# Patient Record
Sex: Female | Born: 1935 | Race: White | Hispanic: No | Marital: Married | Smoking: Former smoker
Health system: Southern US, Community
[De-identification: ages and names within clinical notes are randomized; demographics above are authoritative.]

## PROBLEM LIST (undated history)

## (undated) DIAGNOSIS — I671 Cerebral aneurysm, nonruptured: Secondary | ICD-10-CM

## (undated) DIAGNOSIS — F32A Depression, unspecified: Secondary | ICD-10-CM

## (undated) HISTORY — DX: Depression, unspecified: F32.A

## (undated) HISTORY — DX: Cerebral aneurysm, nonruptured: I67.1

## (undated) HISTORY — PX: OTHER SURGICAL HISTORY: SHX169

---

## 2001-01-05 ENCOUNTER — Ambulatory Visit (HOSPITAL_COMMUNITY): Admission: RE | Admit: 2001-01-05 | Discharge: 2001-01-06 | Payer: Self-pay | Admitting: Ophthalmology

## 2001-01-05 ENCOUNTER — Encounter: Payer: Self-pay | Admitting: Ophthalmology

## 2006-03-04 ENCOUNTER — Ambulatory Visit: Payer: Self-pay | Admitting: Family Medicine

## 2006-06-27 ENCOUNTER — Encounter: Admission: RE | Admit: 2006-06-27 | Discharge: 2006-06-27 | Payer: Self-pay | Admitting: Neurological Surgery

## 2006-07-28 ENCOUNTER — Encounter: Admission: RE | Admit: 2006-07-28 | Discharge: 2006-07-28 | Payer: Self-pay | Admitting: Neurological Surgery

## 2008-03-26 IMAGING — CR DG LUMBAR SPINE 2-3V
3 series · 3 of 3 positions shown · non-contrast
Comparison: none

CLINICAL DATA: Low back pain.  Injured recently.
 LUMBAR SPINE ? 2 VIEW:

[t l-spine a.p.]
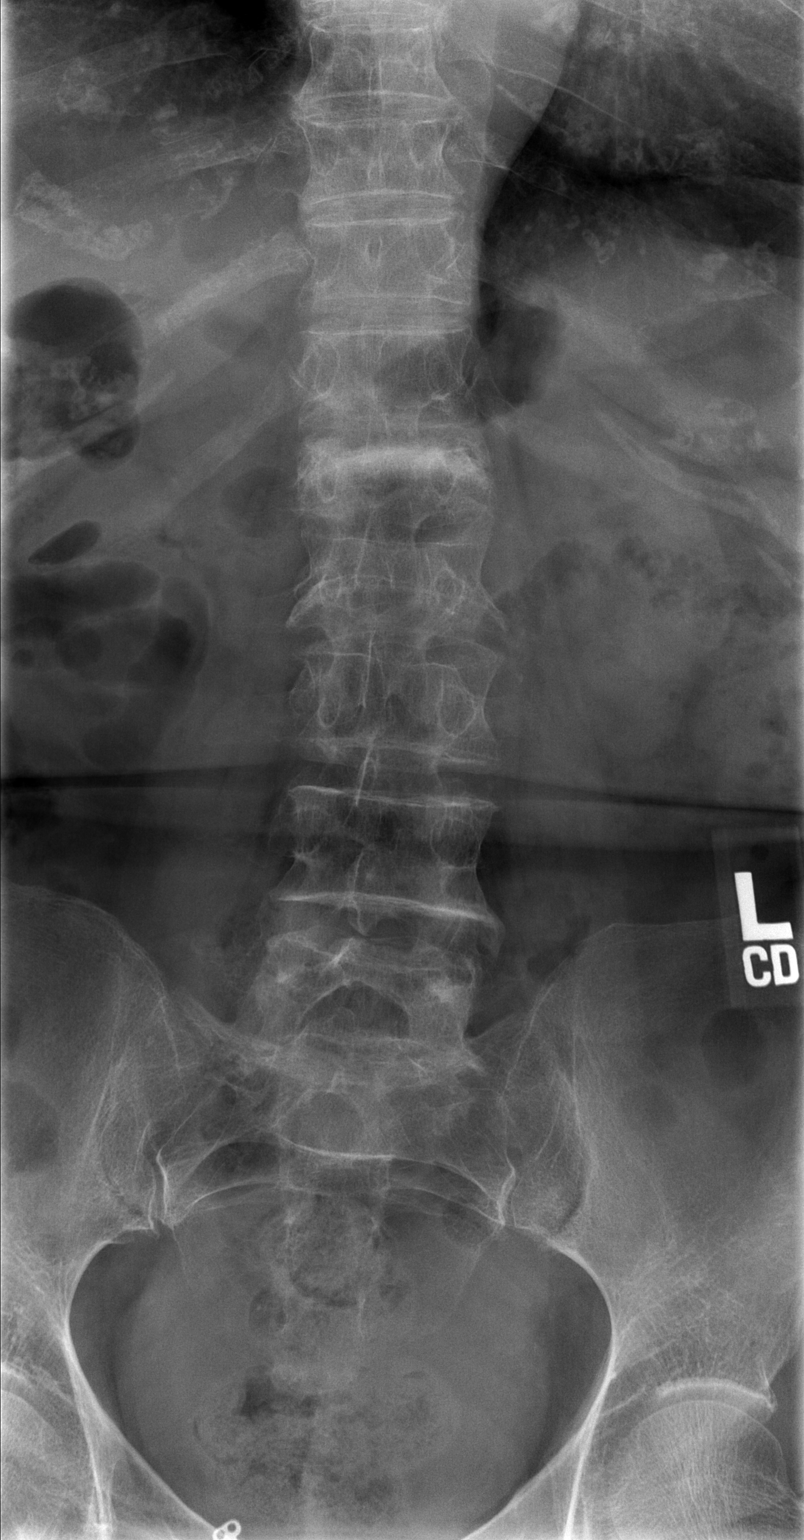

[t l-spine lat]
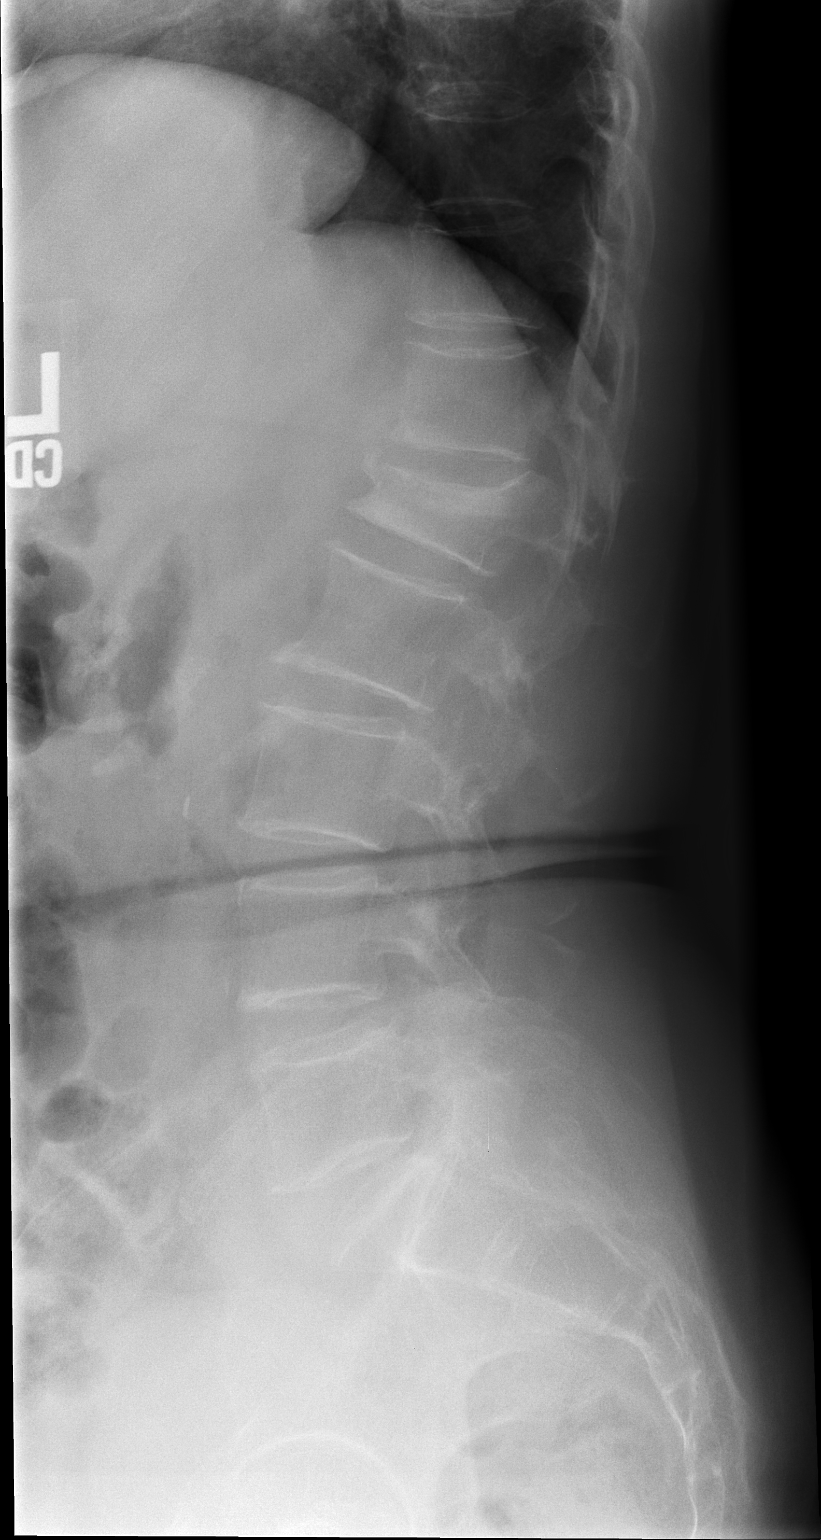

[t l-spine l5-s1 spot]
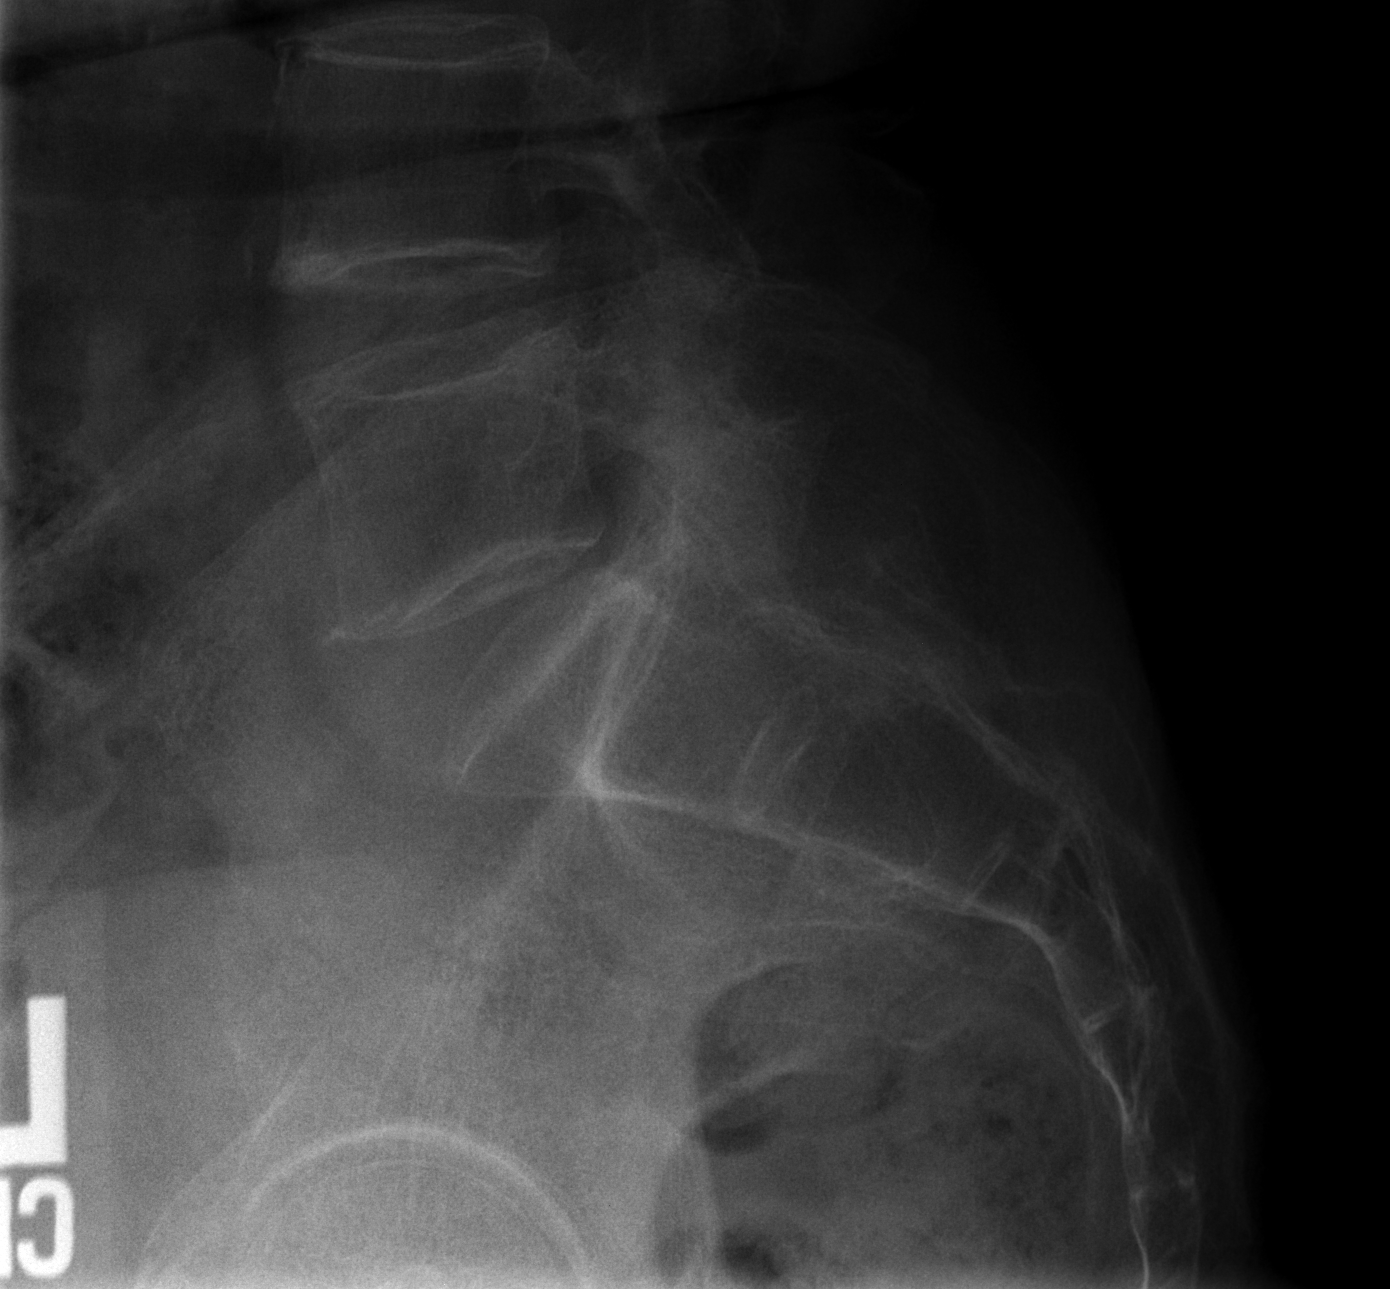

[3 of 3 positions shown; findings below may reference images not displayed]

FINDINGS: Two views of the lumbar spine show a compression deformity of the L1 vertebral body of approximately 50%.  No definite retropulsion is seen, and normal alignment otherwise is maintained.  The bones are osteopenic.   The SI joints appear normal.
IMPRESSION: Approximately 50% compression deformity of the L1 vertebral body.  Normal alignment.

## 2008-04-26 IMAGING — CR DG LUMBAR SPINE 2-3V
3 series · 3 of 3 positions shown · non-contrast
Comparison: 06/27/2006.

CLINICAL DATA: Followup L1 compression fracture. Low back pain intermittently,
with certain movements.

LUMBAR SPINE - 3 VIEW

[view not recorded (1 of 3)]
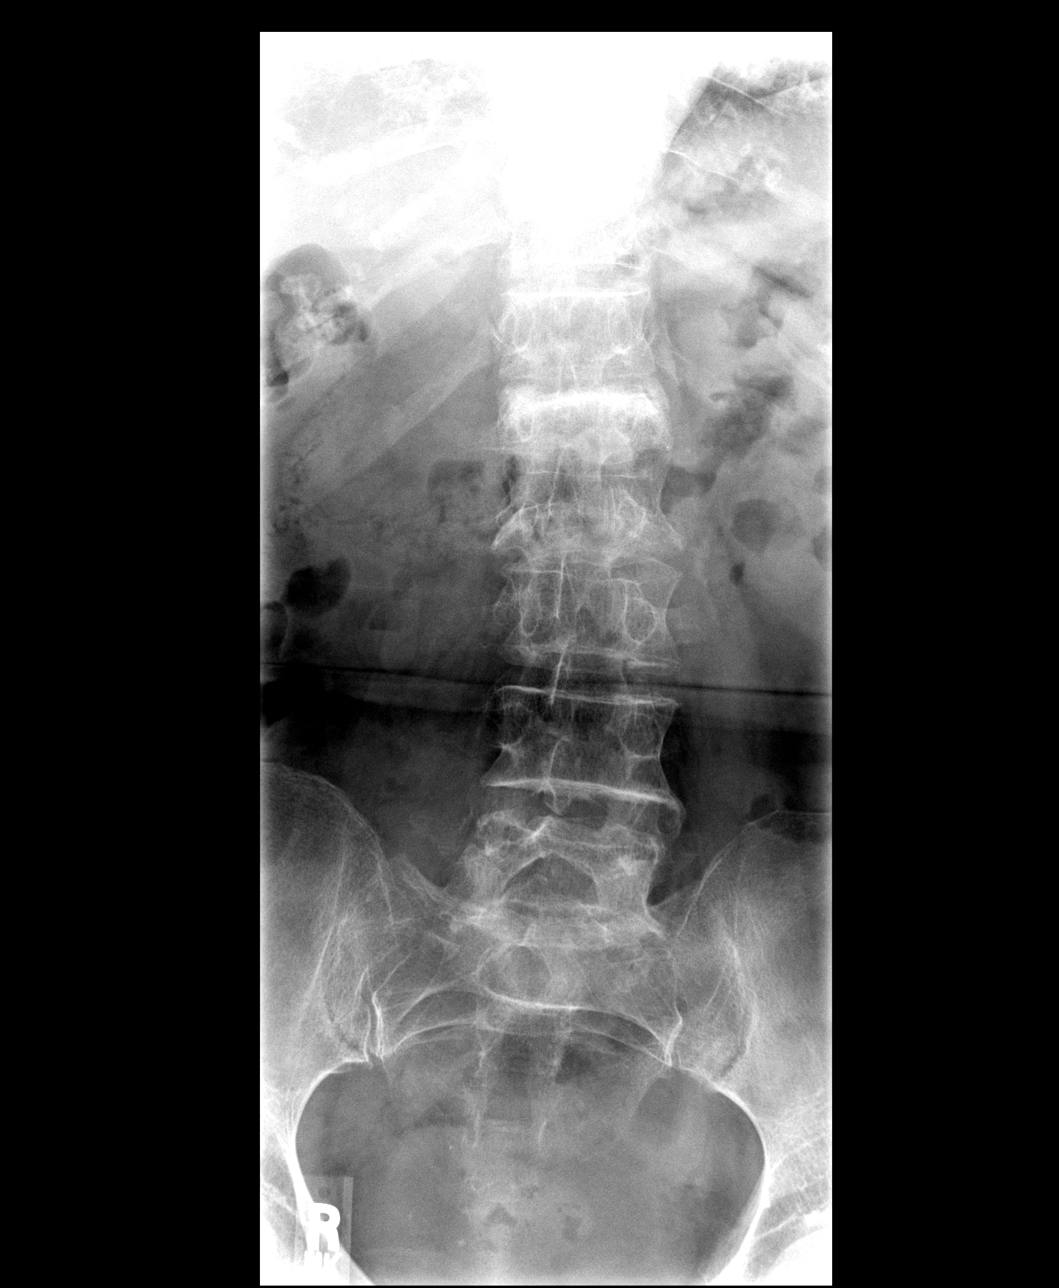

[view not recorded (2 of 3)]
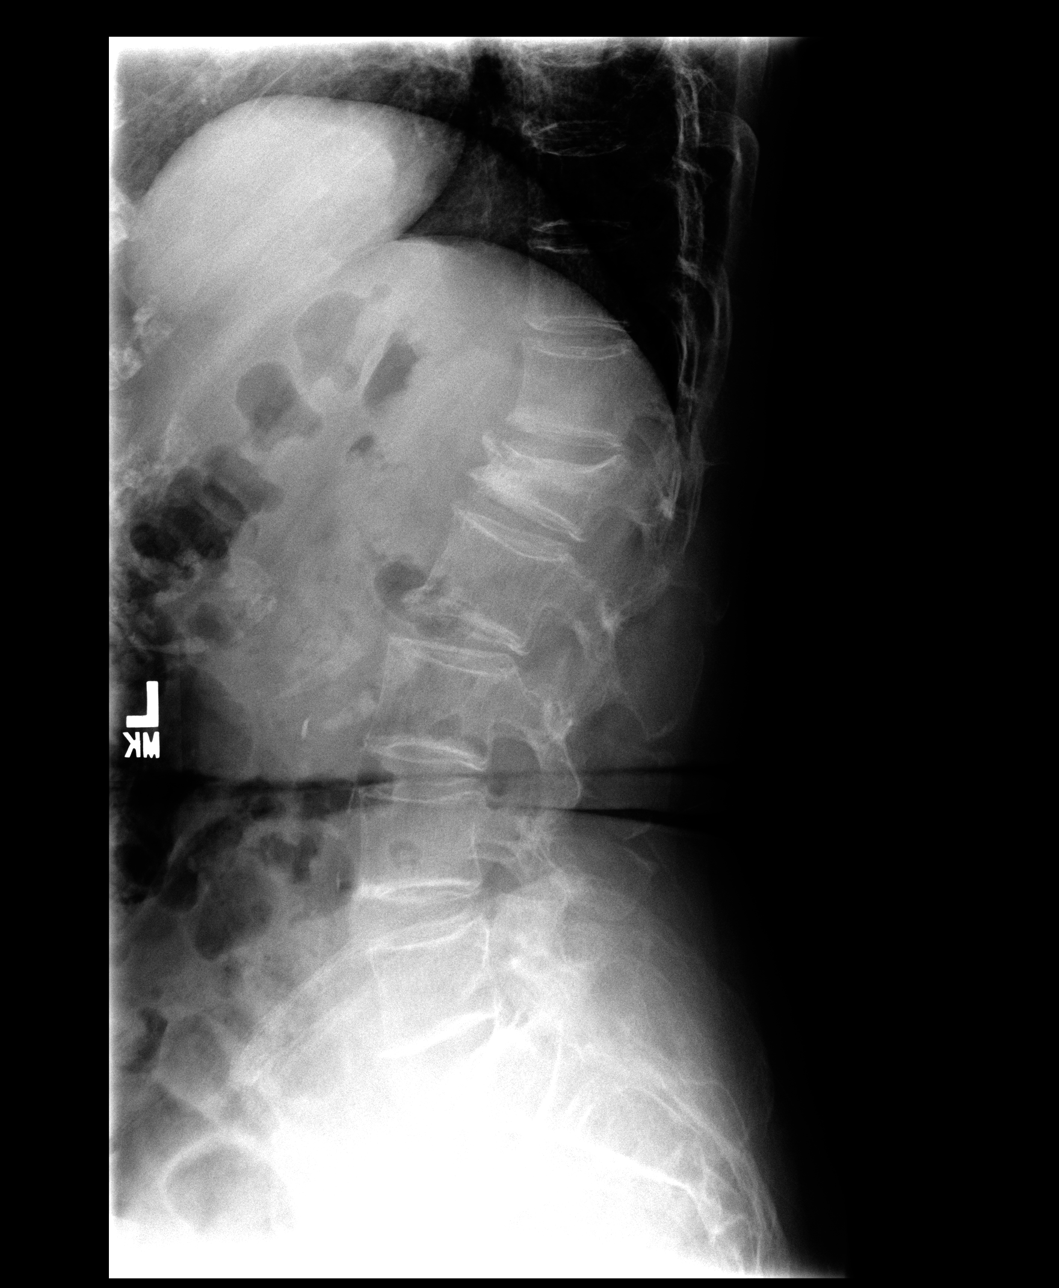

[view not recorded (3 of 3)]
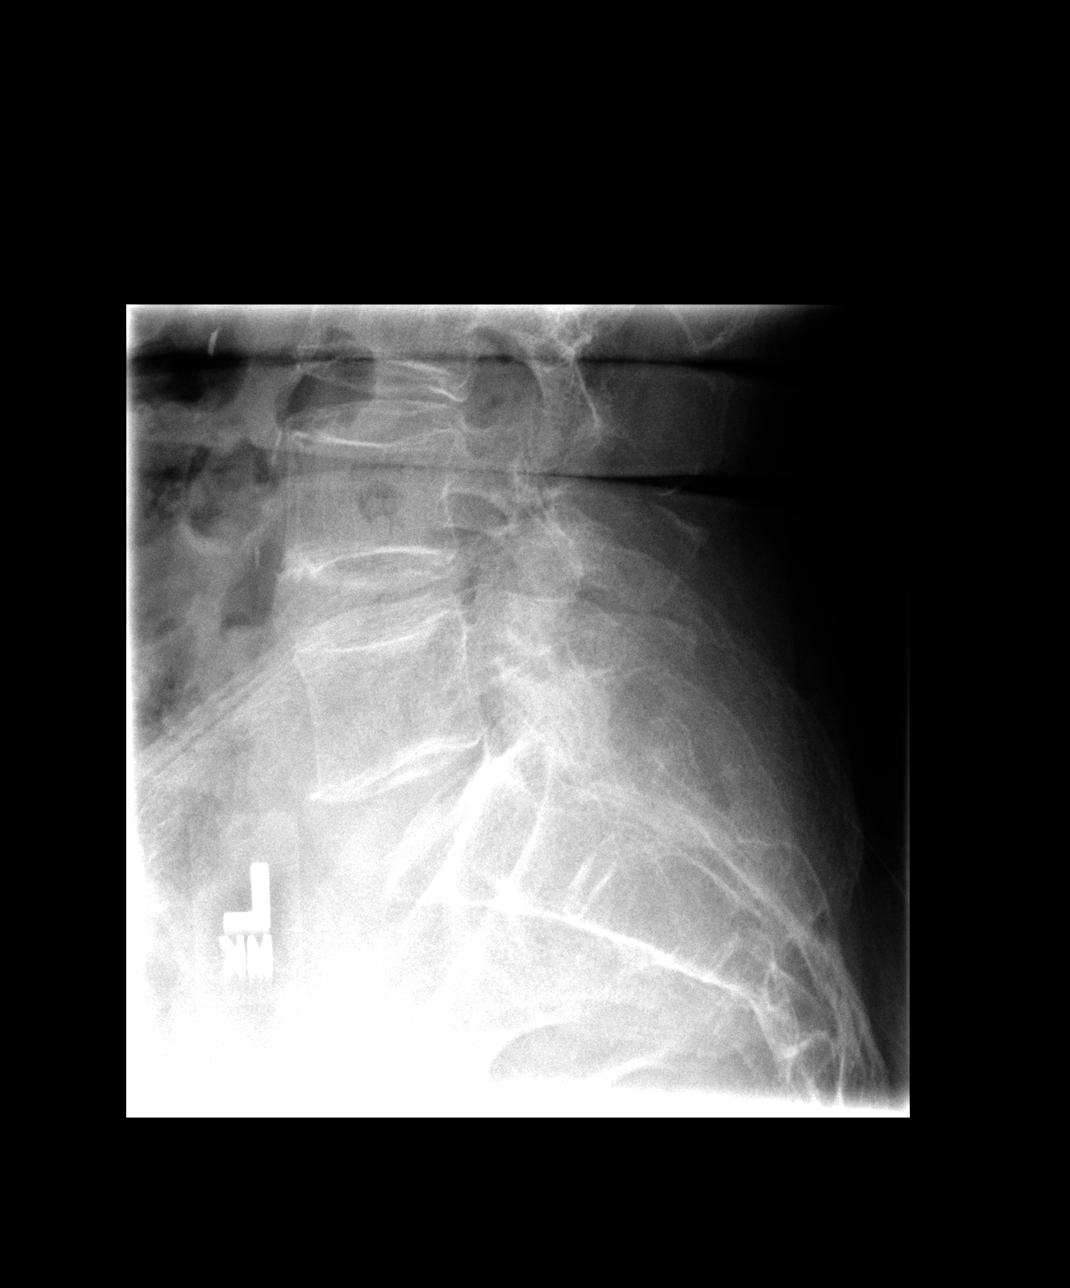

[3 of 3 positions shown; findings below may reference images not displayed]

FINDINGS: Five nonrib-bearing lumbar vertebrae. No stable mild levoconvex
lumbar rotary scoliosis. Mildly progressive sclerosis associated with an
approximately 50% L1 vertebral compression deformity. The degree of compression
is unchanged. No new fractures are seen. Lower lumbar spine facet degenerative
changes and mild to moderate anterior spondylosis at the L2-L3 level.

IMPRESSION

1. Mild increase in sclerosis associated with an approximately 50% L1 vertebral
compression fracture, compatible with further healing.
2. Stable degenerative changes.
3. No acute abnormality.

## 2017-10-22 DIAGNOSIS — Z1331 Encounter for screening for depression: Secondary | ICD-10-CM | POA: Diagnosis not present

## 2017-10-22 DIAGNOSIS — Z6823 Body mass index (BMI) 23.0-23.9, adult: Secondary | ICD-10-CM | POA: Diagnosis not present

## 2017-10-22 DIAGNOSIS — I671 Cerebral aneurysm, nonruptured: Secondary | ICD-10-CM | POA: Diagnosis not present

## 2017-10-22 DIAGNOSIS — Z2821 Immunization not carried out because of patient refusal: Secondary | ICD-10-CM | POA: Diagnosis not present

## 2017-10-22 DIAGNOSIS — Z1339 Encounter for screening examination for other mental health and behavioral disorders: Secondary | ICD-10-CM | POA: Diagnosis not present

## 2017-10-22 DIAGNOSIS — R42 Dizziness and giddiness: Secondary | ICD-10-CM | POA: Diagnosis not present

## 2017-10-22 DIAGNOSIS — F329 Major depressive disorder, single episode, unspecified: Secondary | ICD-10-CM | POA: Diagnosis not present

## 2017-10-22 DIAGNOSIS — Z9181 History of falling: Secondary | ICD-10-CM | POA: Diagnosis not present

## 2017-11-17 DIAGNOSIS — I671 Cerebral aneurysm, nonruptured: Secondary | ICD-10-CM | POA: Diagnosis not present

## 2017-11-17 DIAGNOSIS — R42 Dizziness and giddiness: Secondary | ICD-10-CM | POA: Diagnosis not present

## 2017-12-23 DIAGNOSIS — J329 Chronic sinusitis, unspecified: Secondary | ICD-10-CM | POA: Diagnosis not present

## 2017-12-23 DIAGNOSIS — Z6824 Body mass index (BMI) 24.0-24.9, adult: Secondary | ICD-10-CM | POA: Diagnosis not present

## 2017-12-25 DIAGNOSIS — Z6824 Body mass index (BMI) 24.0-24.9, adult: Secondary | ICD-10-CM | POA: Diagnosis not present

## 2017-12-25 DIAGNOSIS — J40 Bronchitis, not specified as acute or chronic: Secondary | ICD-10-CM | POA: Diagnosis not present

## 2018-01-07 DIAGNOSIS — L821 Other seborrheic keratosis: Secondary | ICD-10-CM | POA: Diagnosis not present

## 2018-01-07 DIAGNOSIS — L570 Actinic keratosis: Secondary | ICD-10-CM | POA: Diagnosis not present

## 2018-01-07 DIAGNOSIS — L578 Other skin changes due to chronic exposure to nonionizing radiation: Secondary | ICD-10-CM | POA: Diagnosis not present

## 2018-01-30 DIAGNOSIS — Z6824 Body mass index (BMI) 24.0-24.9, adult: Secondary | ICD-10-CM | POA: Diagnosis not present

## 2018-01-30 DIAGNOSIS — J40 Bronchitis, not specified as acute or chronic: Secondary | ICD-10-CM | POA: Diagnosis not present

## 2018-03-02 DIAGNOSIS — S52122A Displaced fracture of head of left radius, initial encounter for closed fracture: Secondary | ICD-10-CM | POA: Diagnosis not present

## 2018-03-02 DIAGNOSIS — S62102A Fracture of unspecified carpal bone, left wrist, initial encounter for closed fracture: Secondary | ICD-10-CM | POA: Diagnosis not present

## 2018-03-02 DIAGNOSIS — S62002A Unspecified fracture of navicular [scaphoid] bone of left wrist, initial encounter for closed fracture: Secondary | ICD-10-CM | POA: Diagnosis not present

## 2018-03-02 DIAGNOSIS — S0990XA Unspecified injury of head, initial encounter: Secondary | ICD-10-CM | POA: Diagnosis not present

## 2018-03-06 DIAGNOSIS — S52532A Colles' fracture of left radius, initial encounter for closed fracture: Secondary | ICD-10-CM | POA: Diagnosis not present

## 2018-03-06 DIAGNOSIS — M25532 Pain in left wrist: Secondary | ICD-10-CM | POA: Diagnosis not present

## 2018-03-10 DIAGNOSIS — S52572A Other intraarticular fracture of lower end of left radius, initial encounter for closed fracture: Secondary | ICD-10-CM | POA: Diagnosis not present

## 2018-03-10 DIAGNOSIS — G8918 Other acute postprocedural pain: Secondary | ICD-10-CM | POA: Diagnosis not present

## 2018-03-25 DIAGNOSIS — S52532D Colles' fracture of left radius, subsequent encounter for closed fracture with routine healing: Secondary | ICD-10-CM | POA: Diagnosis not present

## 2018-03-25 DIAGNOSIS — M25532 Pain in left wrist: Secondary | ICD-10-CM | POA: Diagnosis not present

## 2018-04-08 DIAGNOSIS — Z4789 Encounter for other orthopedic aftercare: Secondary | ICD-10-CM | POA: Diagnosis not present

## 2018-04-08 DIAGNOSIS — M25532 Pain in left wrist: Secondary | ICD-10-CM | POA: Diagnosis not present

## 2018-04-08 DIAGNOSIS — S52532A Colles' fracture of left radius, initial encounter for closed fracture: Secondary | ICD-10-CM | POA: Diagnosis not present

## 2018-04-13 DIAGNOSIS — M25532 Pain in left wrist: Secondary | ICD-10-CM | POA: Diagnosis not present

## 2018-04-13 DIAGNOSIS — M25632 Stiffness of left wrist, not elsewhere classified: Secondary | ICD-10-CM | POA: Diagnosis not present

## 2018-04-16 DIAGNOSIS — M25632 Stiffness of left wrist, not elsewhere classified: Secondary | ICD-10-CM | POA: Diagnosis not present

## 2018-04-16 DIAGNOSIS — M25532 Pain in left wrist: Secondary | ICD-10-CM | POA: Diagnosis not present

## 2018-04-20 DIAGNOSIS — M25632 Stiffness of left wrist, not elsewhere classified: Secondary | ICD-10-CM | POA: Diagnosis not present

## 2018-04-20 DIAGNOSIS — M25532 Pain in left wrist: Secondary | ICD-10-CM | POA: Diagnosis not present

## 2018-04-23 DIAGNOSIS — M25532 Pain in left wrist: Secondary | ICD-10-CM | POA: Diagnosis not present

## 2018-04-23 DIAGNOSIS — M25632 Stiffness of left wrist, not elsewhere classified: Secondary | ICD-10-CM | POA: Diagnosis not present

## 2018-04-27 DIAGNOSIS — M25532 Pain in left wrist: Secondary | ICD-10-CM | POA: Diagnosis not present

## 2018-04-27 DIAGNOSIS — M25632 Stiffness of left wrist, not elsewhere classified: Secondary | ICD-10-CM | POA: Diagnosis not present

## 2018-04-29 DIAGNOSIS — Z4789 Encounter for other orthopedic aftercare: Secondary | ICD-10-CM | POA: Diagnosis not present

## 2018-04-29 DIAGNOSIS — M25532 Pain in left wrist: Secondary | ICD-10-CM | POA: Diagnosis not present

## 2018-04-29 DIAGNOSIS — S52532D Colles' fracture of left radius, subsequent encounter for closed fracture with routine healing: Secondary | ICD-10-CM | POA: Diagnosis not present

## 2018-04-29 DIAGNOSIS — S52502D Unspecified fracture of the lower end of left radius, subsequent encounter for closed fracture with routine healing: Secondary | ICD-10-CM | POA: Diagnosis not present

## 2018-04-30 DIAGNOSIS — M25632 Stiffness of left wrist, not elsewhere classified: Secondary | ICD-10-CM | POA: Diagnosis not present

## 2018-04-30 DIAGNOSIS — M25532 Pain in left wrist: Secondary | ICD-10-CM | POA: Diagnosis not present

## 2018-05-04 DIAGNOSIS — M25632 Stiffness of left wrist, not elsewhere classified: Secondary | ICD-10-CM | POA: Diagnosis not present

## 2018-05-04 DIAGNOSIS — M25532 Pain in left wrist: Secondary | ICD-10-CM | POA: Diagnosis not present

## 2018-05-06 DIAGNOSIS — M25632 Stiffness of left wrist, not elsewhere classified: Secondary | ICD-10-CM | POA: Diagnosis not present

## 2018-05-06 DIAGNOSIS — M25532 Pain in left wrist: Secondary | ICD-10-CM | POA: Diagnosis not present

## 2018-05-12 DIAGNOSIS — M25532 Pain in left wrist: Secondary | ICD-10-CM | POA: Diagnosis not present

## 2018-05-12 DIAGNOSIS — M25632 Stiffness of left wrist, not elsewhere classified: Secondary | ICD-10-CM | POA: Diagnosis not present

## 2018-05-14 DIAGNOSIS — M25632 Stiffness of left wrist, not elsewhere classified: Secondary | ICD-10-CM | POA: Diagnosis not present

## 2018-05-14 DIAGNOSIS — M25532 Pain in left wrist: Secondary | ICD-10-CM | POA: Diagnosis not present

## 2018-05-19 DIAGNOSIS — M25532 Pain in left wrist: Secondary | ICD-10-CM | POA: Diagnosis not present

## 2018-05-19 DIAGNOSIS — M25632 Stiffness of left wrist, not elsewhere classified: Secondary | ICD-10-CM | POA: Diagnosis not present

## 2018-05-21 DIAGNOSIS — M25632 Stiffness of left wrist, not elsewhere classified: Secondary | ICD-10-CM | POA: Diagnosis not present

## 2018-05-21 DIAGNOSIS — M25532 Pain in left wrist: Secondary | ICD-10-CM | POA: Diagnosis not present

## 2018-05-26 DIAGNOSIS — M25532 Pain in left wrist: Secondary | ICD-10-CM | POA: Diagnosis not present

## 2018-05-26 DIAGNOSIS — M25632 Stiffness of left wrist, not elsewhere classified: Secondary | ICD-10-CM | POA: Diagnosis not present

## 2018-05-28 DIAGNOSIS — M25632 Stiffness of left wrist, not elsewhere classified: Secondary | ICD-10-CM | POA: Diagnosis not present

## 2018-05-28 DIAGNOSIS — M25532 Pain in left wrist: Secondary | ICD-10-CM | POA: Diagnosis not present

## 2018-06-08 DIAGNOSIS — Z4789 Encounter for other orthopedic aftercare: Secondary | ICD-10-CM | POA: Diagnosis not present

## 2018-06-08 DIAGNOSIS — S52532A Colles' fracture of left radius, initial encounter for closed fracture: Secondary | ICD-10-CM | POA: Diagnosis not present

## 2018-06-08 DIAGNOSIS — M25532 Pain in left wrist: Secondary | ICD-10-CM | POA: Diagnosis not present

## 2018-08-21 DIAGNOSIS — Z1231 Encounter for screening mammogram for malignant neoplasm of breast: Secondary | ICD-10-CM | POA: Diagnosis not present

## 2018-10-04 DIAGNOSIS — N132 Hydronephrosis with renal and ureteral calculous obstruction: Secondary | ICD-10-CM | POA: Diagnosis not present

## 2018-10-04 DIAGNOSIS — N201 Calculus of ureter: Secondary | ICD-10-CM | POA: Diagnosis not present

## 2018-10-22 DIAGNOSIS — N201 Calculus of ureter: Secondary | ICD-10-CM | POA: Diagnosis not present

## 2018-10-22 DIAGNOSIS — N202 Calculus of kidney with calculus of ureter: Secondary | ICD-10-CM | POA: Diagnosis not present

## 2018-10-22 DIAGNOSIS — N200 Calculus of kidney: Secondary | ICD-10-CM | POA: Diagnosis not present

## 2018-11-05 DIAGNOSIS — Z2821 Immunization not carried out because of patient refusal: Secondary | ICD-10-CM | POA: Diagnosis not present

## 2018-11-05 DIAGNOSIS — Z1331 Encounter for screening for depression: Secondary | ICD-10-CM | POA: Diagnosis not present

## 2018-11-05 DIAGNOSIS — Z6824 Body mass index (BMI) 24.0-24.9, adult: Secondary | ICD-10-CM | POA: Diagnosis not present

## 2018-11-05 DIAGNOSIS — Z1339 Encounter for screening examination for other mental health and behavioral disorders: Secondary | ICD-10-CM | POA: Diagnosis not present

## 2018-11-05 DIAGNOSIS — M546 Pain in thoracic spine: Secondary | ICD-10-CM | POA: Diagnosis not present

## 2018-11-05 DIAGNOSIS — Z0000 Encounter for general adult medical examination without abnormal findings: Secondary | ICD-10-CM | POA: Diagnosis not present

## 2018-12-01 DIAGNOSIS — H353131 Nonexudative age-related macular degeneration, bilateral, early dry stage: Secondary | ICD-10-CM | POA: Diagnosis not present

## 2018-12-01 DIAGNOSIS — H5201 Hypermetropia, right eye: Secondary | ICD-10-CM | POA: Diagnosis not present

## 2018-12-01 DIAGNOSIS — H35342 Macular cyst, hole, or pseudohole, left eye: Secondary | ICD-10-CM | POA: Diagnosis not present

## 2018-12-04 DIAGNOSIS — M545 Low back pain: Secondary | ICD-10-CM | POA: Diagnosis not present

## 2018-12-04 DIAGNOSIS — S79912A Unspecified injury of left hip, initial encounter: Secondary | ICD-10-CM | POA: Diagnosis not present

## 2018-12-05 DIAGNOSIS — S79912A Unspecified injury of left hip, initial encounter: Secondary | ICD-10-CM | POA: Diagnosis not present

## 2018-12-07 DIAGNOSIS — M549 Dorsalgia, unspecified: Secondary | ICD-10-CM | POA: Diagnosis not present

## 2018-12-14 DIAGNOSIS — M545 Low back pain: Secondary | ICD-10-CM | POA: Diagnosis not present

## 2018-12-22 DIAGNOSIS — M5416 Radiculopathy, lumbar region: Secondary | ICD-10-CM | POA: Diagnosis not present

## 2018-12-24 DIAGNOSIS — M4856XD Collapsed vertebra, not elsewhere classified, lumbar region, subsequent encounter for fracture with routine healing: Secondary | ICD-10-CM | POA: Diagnosis not present

## 2018-12-24 DIAGNOSIS — M545 Low back pain: Secondary | ICD-10-CM | POA: Diagnosis not present

## 2019-01-07 DIAGNOSIS — L821 Other seborrheic keratosis: Secondary | ICD-10-CM | POA: Diagnosis not present

## 2019-01-07 DIAGNOSIS — L578 Other skin changes due to chronic exposure to nonionizing radiation: Secondary | ICD-10-CM | POA: Diagnosis not present

## 2019-01-25 DIAGNOSIS — M545 Low back pain: Secondary | ICD-10-CM | POA: Diagnosis not present

## 2019-02-08 DIAGNOSIS — Z6824 Body mass index (BMI) 24.0-24.9, adult: Secondary | ICD-10-CM | POA: Diagnosis not present

## 2019-02-08 DIAGNOSIS — J40 Bronchitis, not specified as acute or chronic: Secondary | ICD-10-CM | POA: Diagnosis not present

## 2019-02-08 DIAGNOSIS — J329 Chronic sinusitis, unspecified: Secondary | ICD-10-CM | POA: Diagnosis not present

## 2019-02-08 DIAGNOSIS — L989 Disorder of the skin and subcutaneous tissue, unspecified: Secondary | ICD-10-CM | POA: Diagnosis not present

## 2019-02-16 DIAGNOSIS — L821 Other seborrheic keratosis: Secondary | ICD-10-CM | POA: Diagnosis not present

## 2019-02-16 DIAGNOSIS — L578 Other skin changes due to chronic exposure to nonionizing radiation: Secondary | ICD-10-CM | POA: Diagnosis not present

## 2019-02-16 DIAGNOSIS — L720 Epidermal cyst: Secondary | ICD-10-CM | POA: Diagnosis not present

## 2019-05-13 DIAGNOSIS — L821 Other seborrheic keratosis: Secondary | ICD-10-CM | POA: Diagnosis not present

## 2019-05-15 DIAGNOSIS — M549 Dorsalgia, unspecified: Secondary | ICD-10-CM | POA: Diagnosis not present

## 2019-05-15 DIAGNOSIS — F331 Major depressive disorder, recurrent, moderate: Secondary | ICD-10-CM | POA: Diagnosis not present

## 2019-06-15 DIAGNOSIS — I671 Cerebral aneurysm, nonruptured: Secondary | ICD-10-CM | POA: Diagnosis not present

## 2019-06-15 DIAGNOSIS — M546 Pain in thoracic spine: Secondary | ICD-10-CM | POA: Diagnosis not present

## 2019-07-16 DIAGNOSIS — I671 Cerebral aneurysm, nonruptured: Secondary | ICD-10-CM | POA: Diagnosis not present

## 2019-07-16 DIAGNOSIS — M810 Age-related osteoporosis without current pathological fracture: Secondary | ICD-10-CM | POA: Diagnosis not present

## 2019-07-29 DIAGNOSIS — I671 Cerebral aneurysm, nonruptured: Secondary | ICD-10-CM | POA: Diagnosis not present

## 2019-07-29 DIAGNOSIS — Z6823 Body mass index (BMI) 23.0-23.9, adult: Secondary | ICD-10-CM | POA: Diagnosis not present

## 2019-07-29 DIAGNOSIS — Z79899 Other long term (current) drug therapy: Secondary | ICD-10-CM | POA: Diagnosis not present

## 2019-07-29 DIAGNOSIS — Z0000 Encounter for general adult medical examination without abnormal findings: Secondary | ICD-10-CM | POA: Diagnosis not present

## 2019-08-28 DIAGNOSIS — Z1231 Encounter for screening mammogram for malignant neoplasm of breast: Secondary | ICD-10-CM | POA: Diagnosis not present

## 2019-10-16 DIAGNOSIS — F329 Major depressive disorder, single episode, unspecified: Secondary | ICD-10-CM | POA: Diagnosis not present

## 2019-10-16 DIAGNOSIS — I671 Cerebral aneurysm, nonruptured: Secondary | ICD-10-CM | POA: Diagnosis not present

## 2019-11-15 DIAGNOSIS — F329 Major depressive disorder, single episode, unspecified: Secondary | ICD-10-CM | POA: Diagnosis not present

## 2019-11-15 DIAGNOSIS — I671 Cerebral aneurysm, nonruptured: Secondary | ICD-10-CM | POA: Diagnosis not present

## 2019-11-30 DIAGNOSIS — Z6823 Body mass index (BMI) 23.0-23.9, adult: Secondary | ICD-10-CM | POA: Diagnosis not present

## 2019-11-30 DIAGNOSIS — J309 Allergic rhinitis, unspecified: Secondary | ICD-10-CM | POA: Diagnosis not present

## 2019-12-15 DIAGNOSIS — S0990XA Unspecified injury of head, initial encounter: Secondary | ICD-10-CM | POA: Diagnosis not present

## 2019-12-15 DIAGNOSIS — R58 Hemorrhage, not elsewhere classified: Secondary | ICD-10-CM | POA: Diagnosis not present

## 2019-12-15 DIAGNOSIS — W19XXXA Unspecified fall, initial encounter: Secondary | ICD-10-CM | POA: Diagnosis not present

## 2019-12-15 DIAGNOSIS — S82002A Unspecified fracture of left patella, initial encounter for closed fracture: Secondary | ICD-10-CM | POA: Diagnosis not present

## 2019-12-15 DIAGNOSIS — S199XXA Unspecified injury of neck, initial encounter: Secondary | ICD-10-CM | POA: Diagnosis not present

## 2019-12-15 DIAGNOSIS — S0101XA Laceration without foreign body of scalp, initial encounter: Secondary | ICD-10-CM | POA: Diagnosis not present

## 2019-12-15 DIAGNOSIS — S82035A Nondisplaced transverse fracture of left patella, initial encounter for closed fracture: Secondary | ICD-10-CM | POA: Diagnosis not present

## 2019-12-16 DIAGNOSIS — S82035A Nondisplaced transverse fracture of left patella, initial encounter for closed fracture: Secondary | ICD-10-CM | POA: Diagnosis not present

## 2019-12-16 DIAGNOSIS — M25562 Pain in left knee: Secondary | ICD-10-CM | POA: Diagnosis not present

## 2020-01-12 DIAGNOSIS — S82035D Nondisplaced transverse fracture of left patella, subsequent encounter for closed fracture with routine healing: Secondary | ICD-10-CM | POA: Diagnosis not present

## 2020-01-12 DIAGNOSIS — M25562 Pain in left knee: Secondary | ICD-10-CM | POA: Diagnosis not present

## 2020-01-24 DIAGNOSIS — L820 Inflamed seborrheic keratosis: Secondary | ICD-10-CM | POA: Diagnosis not present

## 2020-01-24 DIAGNOSIS — L821 Other seborrheic keratosis: Secondary | ICD-10-CM | POA: Diagnosis not present

## 2020-02-17 DIAGNOSIS — H524 Presbyopia: Secondary | ICD-10-CM | POA: Diagnosis not present

## 2020-02-17 DIAGNOSIS — H26491 Other secondary cataract, right eye: Secondary | ICD-10-CM | POA: Diagnosis not present

## 2020-02-17 DIAGNOSIS — H353131 Nonexudative age-related macular degeneration, bilateral, early dry stage: Secondary | ICD-10-CM | POA: Diagnosis not present

## 2020-02-17 DIAGNOSIS — Z961 Presence of intraocular lens: Secondary | ICD-10-CM | POA: Diagnosis not present

## 2020-02-17 DIAGNOSIS — H35343 Macular cyst, hole, or pseudohole, bilateral: Secondary | ICD-10-CM | POA: Diagnosis not present

## 2020-03-08 DIAGNOSIS — H524 Presbyopia: Secondary | ICD-10-CM | POA: Diagnosis not present

## 2020-03-08 DIAGNOSIS — H35343 Macular cyst, hole, or pseudohole, bilateral: Secondary | ICD-10-CM | POA: Diagnosis not present

## 2020-03-08 DIAGNOSIS — H26491 Other secondary cataract, right eye: Secondary | ICD-10-CM | POA: Diagnosis not present

## 2020-03-08 DIAGNOSIS — H353131 Nonexudative age-related macular degeneration, bilateral, early dry stage: Secondary | ICD-10-CM | POA: Diagnosis not present

## 2020-03-08 DIAGNOSIS — Z961 Presence of intraocular lens: Secondary | ICD-10-CM | POA: Diagnosis not present

## 2020-04-19 DIAGNOSIS — H5203 Hypermetropia, bilateral: Secondary | ICD-10-CM | POA: Diagnosis not present

## 2020-04-19 DIAGNOSIS — H524 Presbyopia: Secondary | ICD-10-CM | POA: Diagnosis not present

## 2020-04-19 DIAGNOSIS — H52209 Unspecified astigmatism, unspecified eye: Secondary | ICD-10-CM | POA: Diagnosis not present

## 2020-07-03 DIAGNOSIS — Z1331 Encounter for screening for depression: Secondary | ICD-10-CM | POA: Diagnosis not present

## 2020-07-03 DIAGNOSIS — F329 Major depressive disorder, single episode, unspecified: Secondary | ICD-10-CM | POA: Diagnosis not present

## 2020-07-03 DIAGNOSIS — Z79899 Other long term (current) drug therapy: Secondary | ICD-10-CM | POA: Diagnosis not present

## 2020-07-03 DIAGNOSIS — Z0000 Encounter for general adult medical examination without abnormal findings: Secondary | ICD-10-CM | POA: Diagnosis not present

## 2020-07-03 DIAGNOSIS — R413 Other amnesia: Secondary | ICD-10-CM | POA: Diagnosis not present

## 2020-07-03 DIAGNOSIS — E785 Hyperlipidemia, unspecified: Secondary | ICD-10-CM | POA: Diagnosis not present

## 2020-07-03 DIAGNOSIS — Z6822 Body mass index (BMI) 22.0-22.9, adult: Secondary | ICD-10-CM | POA: Diagnosis not present

## 2020-09-27 DIAGNOSIS — H353131 Nonexudative age-related macular degeneration, bilateral, early dry stage: Secondary | ICD-10-CM | POA: Diagnosis not present

## 2020-09-27 DIAGNOSIS — Z961 Presence of intraocular lens: Secondary | ICD-10-CM | POA: Diagnosis not present

## 2020-09-27 DIAGNOSIS — H26491 Other secondary cataract, right eye: Secondary | ICD-10-CM | POA: Diagnosis not present

## 2020-09-27 DIAGNOSIS — H35343 Macular cyst, hole, or pseudohole, bilateral: Secondary | ICD-10-CM | POA: Diagnosis not present

## 2021-04-10 DIAGNOSIS — R413 Other amnesia: Secondary | ICD-10-CM | POA: Diagnosis not present

## 2021-04-10 DIAGNOSIS — E785 Hyperlipidemia, unspecified: Secondary | ICD-10-CM | POA: Diagnosis not present

## 2021-04-10 DIAGNOSIS — Z6823 Body mass index (BMI) 23.0-23.9, adult: Secondary | ICD-10-CM | POA: Diagnosis not present

## 2021-04-10 DIAGNOSIS — Z79899 Other long term (current) drug therapy: Secondary | ICD-10-CM | POA: Diagnosis not present

## 2021-06-27 ENCOUNTER — Encounter: Payer: Self-pay | Admitting: *Deleted

## 2021-06-28 ENCOUNTER — Ambulatory Visit: Payer: Medicare HMO | Admitting: Neurology

## 2021-06-28 ENCOUNTER — Telehealth: Payer: Self-pay

## 2021-06-28 ENCOUNTER — Encounter: Payer: Self-pay | Admitting: Neurology

## 2021-06-28 VITALS — BP 137/66 | HR 51 | Ht 62.0 in | Wt 118.2 lb

## 2021-06-28 DIAGNOSIS — Z8673 Personal history of transient ischemic attack (TIA), and cerebral infarction without residual deficits: Secondary | ICD-10-CM

## 2021-06-28 DIAGNOSIS — R413 Other amnesia: Secondary | ICD-10-CM

## 2021-06-28 NOTE — Telephone Encounter (Signed)
Received lab results from [facility]. Placed on Dr Teofilo Pod desk.

## 2021-06-28 NOTE — Telephone Encounter (Signed)
I received blood test results from [facility] from 04/10/2021, CBC with differential was unremarkable, in particular, WBC was 5.3, hemoglobin 12.8, hematocrit 37.  CMP showed blood sugar of 87, BUN 20 which is borderline elevated and creatinine 1.0.  AST 11, ALT 9, sodium 140, potassium 3.9.  Lipid panel showed triglycerides of 128, total cholesterol 214, HDL 54, LDL 134.  Please call Dr. Elenor Legato office back as she had done blood work back in July 2021 and I would like to make sure we have those results as well.

## 2021-06-28 NOTE — Progress Notes (Addendum)
Subjective:    Patient ID: Sarah Montgomery is a 87 y.o. female.  HPI    [name], MD, PhD [facility] [address] [address] [place], [place] [address] Dear Dr. Leonor Liv,   I saw your patient, Sarah Montgomery, upon your kind request in my neurologic clinic today for initial consultation of her memory loss.  The patient is accompanied by her daughter, Sarah Montgomery, today.  As you know, Sarah Montgomery is an 87 year old right-handed woman with an underlying medical history of depression, and remote history of hemorrhagic stroke, who reports an at least 1 year history of forgetfulness and misplacing things.  Daughter reports that memory loss has been more noticeable in the past year.  Of note, she was started on donepezil 5 mg strength once daily last year in July.  This was increased to 10 mg once daily in late April 2022.  I reviewed your office note from 04/10/2021.  She had blood work through your office at the time including CBC, CMP and lipid panel.  We will request test results from your office. I also reviewed your office note from 07/03/2020, at which time she had blood work including CBC, CMP, TSH, vitamin B12, RPR.  We will request test results from your office.  She was started on donepezil 5 mg strength at the time.  Prior head CTA according to your records done at [facility] on 11/17/2017 showed old left PCA territory hemorrhagic infarct affecting the medial left occipital lobe.  Dolichoectatic cerebral vasculature without evidence for saccular aneurysm, intracranial stenosis, or occlusion, arteriovenous malformation or other significant findings.  She had a carotid Doppler ultrasound also performed at [facility] on 08/15/2015 which, according to your records, did not show any significant internal carotid artery plaques or stenoses.   She was previously suspected to have a brain aneurysm as I understand.  She has never had any clipping or surgery.  Per  daughter, she had a stroke in 2000.  She is a non-smoker and does not drink alcohol.  She lives alone, has 3 grown daughters.  2 of them live close by.  She reports drinking water on a regular basis and hydrating well.  Appetite is good, weight is stable.  She has an appointment this month to get her hearing aids checked.  She did have one of them repaired in May 2022.  She is hard of hearing.  History is supplemented by her daughter.  Patient denies any family history of dementia.  She has been stable on citalopram.  She drives, typically to church and locally around town.  Daughter reports that she has not driven with her with the patient driving lately but she has driven behind her and noticed no significant issues.  They also have a tracker in her car.  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  She reports an 8th grade education.  Patient has fallen several times by self-report, she injured her right forehead a couple of years ago when she tripped off a curb.  She needed stitches.  She may have had a CT scan at the time, she is not sure.  She does not use a walking aid.  She has not fallen in the past 6 months.  Her Past Medical History Is Significant For: Past Medical History:  Diagnosis Date   Cerebral aneurysm    Depression     Her Past Surgical History Is Significant For: Past Surgical History:  Procedure Laterality Date   brokem wrist  2019    Her Family History Is Significant For: No family history on file.  Her Social History Is Significant For: Social History   Socioeconomic History   Marital status: Married    Spouse name: Not on file   Number of children: Not on file   Years of education: Not on file   Highest education level: Not on file  Occupational History   Not on file  Tobacco Use   Smoking status: Former    Types: Cigarettes   Smokeless tobacco: Never   Tobacco comments:    Quit smoking early 20's      Substance and Sexual Activity   Alcohol use: Never    Drug use: Never   Sexual activity: Not on file  Other Topics Concern   Not on file  Social History Narrative   Lives alone, townhome,  3 children (2 [place], 1 [place]). Retired, 8th grade education.   Caffeine 1 cup daily.    Social Determinants of Health   Financial Resource Strain: Not on file  Food Insecurity: Not on file  Transportation Needs: Not on file  Physical Activity: Not on file  Stress: Not on file  Social Connections: Not on file    Her Allergies Are:  No Known Allergies:   Her Current Medications Are:  Outpatient Encounter Medications as of 06/28/2021  Medication Sig   aspirin EC 81 MG tablet Take 81 mg by mouth daily. Swallow whole.   citalopram (CELEXA) 20 MG tablet TAKE 1 TABLET BY MOUTH EVERY DAY FOR DEPRESSION   donepezil (ARICEPT) 10 MG tablet Take 10 mg by mouth at bedtime.   Multiple Vitamins-Minerals (PRESERVISION AREDS PO) Take by mouth. One tablet daily   [DISCONTINUED] Ascorbic Acid (VITAMIN C) 100 MG tablet Take 100 mg by mouth in the morning and at bedtime.   [DISCONTINUED] donepezil (ARICEPT) 5 MG tablet    No facility-administered encounter medications on file as of 06/28/2021.  :   Review of Systems:  Out of a complete 14 point review of systems, all are reviewed and negative with the exception of these symptoms as listed below:   Review of Systems  Neurological:        Rm 9, daughter.  Evaluation for stroke activity, memory loss   Objective:  Neurological Exam  Physical Exam Physical Examination:   Vitals:   06/28/21 0753  BP: 137/66  Pulse: (!) 51    General Examination: The patient is a very pleasant 87 y.o. female in no acute distress. She appears well-developed and well-nourished and well groomed.   HEENT: Normocephalic, atraumatic, pupils are equal, tracking is well-preserved, hearing is impaired.  Face is symmetric.  Speech without dysarthria but mild low-volume speech.  Neck is supple, no carotid bruits.   Chest: Clear  to auscultation without wheezing, rhonchi or crackles noted.  Heart: S1+S2+0, regular and normal without murmurs, rubs or gallops noted.   Abdomen: Soft, non-tender and non-distended.  Extremities: There is no obvious edema.   Skin: Warm and dry without trophic changes noted.   Musculoskeletal: exam reveals prominent arthritic changes particularly in both hands.  Left shoulder is lower than right and she has a mildly increased upper back curvature, questionable scoliosis.    Neurologically:  Mental status: The patient is awake, alert and oriented.  She is having difficulty relating her history.  She is not able to provide details, she is not sure how long she has had memory issues but does endorse being forgetful and misplacing her  car keys for example.   Thought process is linear. Mood is normal and affect is normal.   On 06/28/2021: MMSE: 20/30 (she lost one-point on orientation, one-point on repetition, one-point on drawing intersecting pentagons, 5 points on serial sevens and 2 points on remote recall), CDT: 2/4 (with third attempt at drawing the clock), AFT: 9/min.  Cranial nerves II - XII are as described above under HEENT exam.  Unequal shoulder height noted.  Motor exam: Thin bulk, global strength of approximately 4 out of 5.  No obvious tremor.  Romberg is not tested due to safety concerns, reflexes are 1+ throughout including ankles.  Fine motor skills are mildly impaired globally, not unusual for age.  Sensory exam is intact to light touch.  Cerebellar testing: No dysmetria or intention tremor on finger to nose testing. Heel to shin is unremarkable bilaterally. There is no truncal or gait ataxia.  Gait, station and balance: She stands without major difficulty.  Posture is mildly stooped, upper back increase in curvature noted.  She walks without a walking aid, overall slightly unsteady especially with turns.   Assessment and Plan:   In summary, Sarah Montgomery is a very pleasant  87 y.o.-year old female with an underlying medical history of depression, and remote history of hemorrhagic stroke, who presents for evaluation of her memory loss of at least 1 years duration.  Her memory scores indicate moderate difficulty with her memory, she does not have a telltale family history of Alzheimer's dementia, may have some vascular risk factors herself.  She is advised to continue with her donepezil which was increased to 10 mg once daily recently.  She has been able to tolerate this.  We can consider a second medication in the near future.  We talked about the importance of healthy lifestyle, including good nutrition, good hydration and we talked about fall prevention.  Of note, she has fallen and injured herself before.  I am not sure she is completely safe to drive.  I talked to her about this.  I would recommend that she no longer drive and she is advised to discuss this with you as well.  In the least, I recommend that her family monitor her driving by sitting with her when she goes to her local church or grocery store.  She has significant difficulty with visual-spatial skills and moderately low memory scores today.  I explained this to the patient and her daughter today. She does not do much in the way of long distance driving.  I would like to proceed with a brain MRI without contrast.  We will call with the results. She is advised to follow-up routinely in this office to see one of our nurse practitioners in about 6 months, sooner if needed.  I answered all her questions today and the patient and her daughter were in agreement.   Thank you very much for allowing me to participate in the care of this nice patient. If I can be of any further assistance to you please do not hesitate to call me at [phone].  Sincerely,   [name], MD, PhD  Addendum: 07/11/2021.  Patient's brain MRI without contrast was denied by her insurance.  I will go ahead and order a head CT without  contrast.

## 2021-06-28 NOTE — Telephone Encounter (Signed)
Recent lab testing has been requested from Dr. Donzetta Sprung office.

## 2021-06-28 NOTE — Patient Instructions (Signed)
You have complaints of memory loss: memory loss or changes in cognitive function can have many reasons and does not always mean you have dementia. Conditions that can contribute to subjective or objective memory loss include: depression, stress, poor sleep from insomnia or sleep apnea, dehydration, fluctuation in blood sugar values, thyroid or electrolyte dysfunction and certain vitamin deficiencies. Dementia can be caused by stroke, brain atherosclerosis or brain vascular disease due to vascular risk factors (smoking, high blood pressure, high cholesterol, obesity and uncontrolled diabetes), certain degenerative brain disorders (including Parkinson's disease and Multiple sclerosis) and by Alzheimer's disease or other, more rare and sometimes hereditary causes.  As discussed, we will proceed with a brain MRI without contrast.  I would like for you to continue with your memory medication called donepezil 10 mg once daily.  We can consider adding a second medication at your next visit. I am worried that you may not be fully safe to drive.  Please discuss with Dr. Leonor Liv as well.  Please ask your family to monitor your driving by sitting with you.   Please follow-up to see one of our nurse practitioners in about 6 months.  We will call you in the meantime to schedule your MRI and also with the results.

## 2021-07-11 ENCOUNTER — Telehealth: Payer: Self-pay | Admitting: Neurology

## 2021-07-11 NOTE — Telephone Encounter (Signed)
7/27 [employer] Auth: [id number] exp. 08/10/21 sent to GI

## 2021-07-11 NOTE — Telephone Encounter (Signed)
Attempted to get auth for MRI brain wo contrast 3 times. Auth could not get approved or even be submitted due to a previous denial of same test. I called patient's insurance to inquire about the denial and who submitted it. I was told to contact patient's PCP. I contacted the PCP, [facility], and they had no recollection of submitting MRI PA for patient. Since I am unable to submit PA for this MRI, would it be possible to place order for MRI brain w/wo contrast or CT?

## 2021-07-11 NOTE — Addendum Note (Signed)
Addended by: [name] on: 07/11/2021 03:55 PM   Modules accepted: Orders

## 2021-07-11 NOTE — Telephone Encounter (Signed)
Due to insurance denial of her brain MRI without contrast I have placed an order for a head CT without contrast as an addendum to the office note on 06/28/2021.

## 2021-07-11 NOTE — Telephone Encounter (Signed)
noted 

## 2021-07-23 ENCOUNTER — Ambulatory Visit
Admission: RE | Admit: 2021-07-23 | Discharge: 2021-07-23 | Disposition: A | Discharge status: Home / Self Care | Payer: Medicare HMO | Source: Ambulatory Visit | Attending: Neurology | Admitting: Neurology

## 2021-07-23 DIAGNOSIS — Z8673 Personal history of transient ischemic attack (TIA), and cerebral infarction without residual deficits: Secondary | ICD-10-CM | POA: Diagnosis not present

## 2021-07-23 DIAGNOSIS — R413 Other amnesia: Secondary | ICD-10-CM | POA: Diagnosis not present

## 2021-07-25 ENCOUNTER — Telehealth: Payer: Self-pay | Admitting: *Deleted

## 2021-07-25 NOTE — Telephone Encounter (Signed)
Patient's daughter, Lupita Leash, called me back and we discussed the CT head results per patient.  She verbalized understanding and she was advised for patient to follow-up as scheduled in January at our office.  She verbalized appreciation for the call.

## 2021-07-25 NOTE — Telephone Encounter (Addendum)
Called pt's daughter Rockne Coons (on [place]) and LVM asking for call back. Left office number in message.   ----- Message from [name], MD sent at 07/24/2021  7:34 AM EDT ----- Please call patient and advise her or her daughter on DPR that her head CT without contrast showed chronic and stable findings compared to her CT scan from December 2020.  No acute findings were seen.  She is advised to follow-up as scheduled.

## 2021-08-03 ENCOUNTER — Other Ambulatory Visit: Payer: Self-pay

## 2021-10-09 DIAGNOSIS — H35343 Macular cyst, hole, or pseudohole, bilateral: Secondary | ICD-10-CM | POA: Diagnosis not present

## 2021-10-09 DIAGNOSIS — Z961 Presence of intraocular lens: Secondary | ICD-10-CM | POA: Diagnosis not present

## 2021-10-09 DIAGNOSIS — H26491 Other secondary cataract, right eye: Secondary | ICD-10-CM | POA: Diagnosis not present

## 2021-10-09 DIAGNOSIS — H353131 Nonexudative age-related macular degeneration, bilateral, early dry stage: Secondary | ICD-10-CM | POA: Diagnosis not present

## 2021-10-09 DIAGNOSIS — H52223 Regular astigmatism, bilateral: Secondary | ICD-10-CM | POA: Diagnosis not present

## 2021-11-16 DIAGNOSIS — M4602 Spinal enthesopathy, cervical region: Secondary | ICD-10-CM | POA: Diagnosis not present

## 2021-11-16 DIAGNOSIS — S0181XA Laceration without foreign body of other part of head, initial encounter: Secondary | ICD-10-CM | POA: Diagnosis not present

## 2021-11-16 DIAGNOSIS — R509 Fever, unspecified: Secondary | ICD-10-CM | POA: Diagnosis not present

## 2021-11-16 DIAGNOSIS — W19XXXA Unspecified fall, initial encounter: Secondary | ICD-10-CM | POA: Diagnosis not present

## 2021-11-16 DIAGNOSIS — S0012XA Contusion of left eyelid and periocular area, initial encounter: Secondary | ICD-10-CM | POA: Diagnosis not present

## 2021-11-16 DIAGNOSIS — I6523 Occlusion and stenosis of bilateral carotid arteries: Secondary | ICD-10-CM | POA: Diagnosis not present

## 2021-11-16 DIAGNOSIS — I700 Atherosclerosis of aorta: Secondary | ICD-10-CM | POA: Diagnosis not present

## 2021-11-16 DIAGNOSIS — S0990XA Unspecified injury of head, initial encounter: Secondary | ICD-10-CM | POA: Diagnosis not present

## 2021-11-16 DIAGNOSIS — M4802 Spinal stenosis, cervical region: Secondary | ICD-10-CM | POA: Diagnosis not present

## 2021-11-16 DIAGNOSIS — R58 Hemorrhage, not elsewhere classified: Secondary | ICD-10-CM | POA: Diagnosis not present

## 2021-11-16 DIAGNOSIS — S0512XA Contusion of eyeball and orbital tissues, left eye, initial encounter: Secondary | ICD-10-CM | POA: Diagnosis not present

## 2021-11-16 DIAGNOSIS — S0101XA Laceration without foreign body of scalp, initial encounter: Secondary | ICD-10-CM | POA: Diagnosis not present

## 2021-11-16 DIAGNOSIS — M5000 Cervical disc disorder with myelopathy, unspecified cervical region: Secondary | ICD-10-CM | POA: Diagnosis not present

## 2021-11-16 DIAGNOSIS — S199XXA Unspecified injury of neck, initial encounter: Secondary | ICD-10-CM | POA: Diagnosis not present

## 2021-11-16 DIAGNOSIS — M5030 Other cervical disc degeneration, unspecified cervical region: Secondary | ICD-10-CM | POA: Diagnosis not present

## 2022-01-01 ENCOUNTER — Ambulatory Visit: Payer: Medicare HMO | Admitting: Family Medicine

## 2022-02-07 DIAGNOSIS — R42 Dizziness and giddiness: Secondary | ICD-10-CM | POA: Diagnosis not present

## 2022-02-07 DIAGNOSIS — M25473 Effusion, unspecified ankle: Secondary | ICD-10-CM | POA: Diagnosis not present

## 2022-02-07 DIAGNOSIS — Z6823 Body mass index (BMI) 23.0-23.9, adult: Secondary | ICD-10-CM | POA: Diagnosis not present

## 2022-02-07 DIAGNOSIS — Z1331 Encounter for screening for depression: Secondary | ICD-10-CM | POA: Diagnosis not present

## 2022-02-07 DIAGNOSIS — I4891 Unspecified atrial fibrillation: Secondary | ICD-10-CM | POA: Diagnosis not present

## 2022-02-07 DIAGNOSIS — Z0000 Encounter for general adult medical examination without abnormal findings: Secondary | ICD-10-CM | POA: Diagnosis not present

## 2022-02-19 DIAGNOSIS — Z79899 Other long term (current) drug therapy: Secondary | ICD-10-CM | POA: Diagnosis not present

## 2022-02-19 DIAGNOSIS — S32010A Wedge compression fracture of first lumbar vertebra, initial encounter for closed fracture: Secondary | ICD-10-CM | POA: Diagnosis not present

## 2022-02-19 DIAGNOSIS — M2578 Osteophyte, vertebrae: Secondary | ICD-10-CM | POA: Diagnosis not present

## 2022-02-19 DIAGNOSIS — M546 Pain in thoracic spine: Secondary | ICD-10-CM | POA: Diagnosis not present

## 2022-02-19 DIAGNOSIS — K449 Diaphragmatic hernia without obstruction or gangrene: Secondary | ICD-10-CM | POA: Diagnosis not present

## 2022-02-19 DIAGNOSIS — Z8673 Personal history of transient ischemic attack (TIA), and cerebral infarction without residual deficits: Secondary | ICD-10-CM | POA: Diagnosis not present

## 2022-02-19 DIAGNOSIS — I517 Cardiomegaly: Secondary | ICD-10-CM | POA: Diagnosis not present

## 2022-02-19 DIAGNOSIS — M545 Low back pain, unspecified: Secondary | ICD-10-CM | POA: Diagnosis not present

## 2022-02-19 DIAGNOSIS — S299XXA Unspecified injury of thorax, initial encounter: Secondary | ICD-10-CM | POA: Diagnosis not present

## 2022-02-19 DIAGNOSIS — S2241XA Multiple fractures of ribs, right side, initial encounter for closed fracture: Secondary | ICD-10-CM | POA: Diagnosis not present

## 2022-02-19 DIAGNOSIS — Z7982 Long term (current) use of aspirin: Secondary | ICD-10-CM | POA: Diagnosis not present

## 2022-02-19 DIAGNOSIS — M549 Dorsalgia, unspecified: Secondary | ICD-10-CM | POA: Diagnosis not present

## 2022-02-19 DIAGNOSIS — F419 Anxiety disorder, unspecified: Secondary | ICD-10-CM | POA: Diagnosis not present

## 2022-02-19 DIAGNOSIS — I700 Atherosclerosis of aorta: Secondary | ICD-10-CM | POA: Diagnosis not present

## 2022-02-19 DIAGNOSIS — I4891 Unspecified atrial fibrillation: Secondary | ICD-10-CM | POA: Diagnosis not present

## 2022-02-19 DIAGNOSIS — M7989 Other specified soft tissue disorders: Secondary | ICD-10-CM | POA: Diagnosis not present

## 2022-02-19 DIAGNOSIS — F32A Depression, unspecified: Secondary | ICD-10-CM | POA: Diagnosis not present

## 2022-02-19 DIAGNOSIS — F0390 Unspecified dementia without behavioral disturbance: Secondary | ICD-10-CM | POA: Diagnosis not present

## 2022-02-19 DIAGNOSIS — S32019A Unspecified fracture of first lumbar vertebra, initial encounter for closed fracture: Secondary | ICD-10-CM | POA: Diagnosis not present

## 2022-02-20 DIAGNOSIS — I700 Atherosclerosis of aorta: Secondary | ICD-10-CM | POA: Diagnosis not present

## 2022-02-20 DIAGNOSIS — I4891 Unspecified atrial fibrillation: Secondary | ICD-10-CM | POA: Diagnosis not present

## 2022-02-20 DIAGNOSIS — R410 Disorientation, unspecified: Secondary | ICD-10-CM | POA: Diagnosis not present

## 2022-02-20 DIAGNOSIS — I272 Pulmonary hypertension, unspecified: Secondary | ICD-10-CM

## 2022-02-20 DIAGNOSIS — I361 Nonrheumatic tricuspid (valve) insufficiency: Secondary | ICD-10-CM

## 2022-02-20 DIAGNOSIS — I517 Cardiomegaly: Secondary | ICD-10-CM | POA: Diagnosis not present

## 2022-02-20 DIAGNOSIS — S2241XA Multiple fractures of ribs, right side, initial encounter for closed fracture: Secondary | ICD-10-CM | POA: Diagnosis not present

## 2022-02-20 DIAGNOSIS — M546 Pain in thoracic spine: Secondary | ICD-10-CM | POA: Diagnosis not present

## 2022-02-20 DIAGNOSIS — Z7982 Long term (current) use of aspirin: Secondary | ICD-10-CM | POA: Diagnosis not present

## 2022-02-20 DIAGNOSIS — J9811 Atelectasis: Secondary | ICD-10-CM | POA: Diagnosis not present

## 2022-02-20 DIAGNOSIS — J90 Pleural effusion, not elsewhere classified: Secondary | ICD-10-CM | POA: Diagnosis not present

## 2022-02-20 DIAGNOSIS — S32010A Wedge compression fracture of first lumbar vertebra, initial encounter for closed fracture: Secondary | ICD-10-CM | POA: Diagnosis not present

## 2022-02-20 DIAGNOSIS — F419 Anxiety disorder, unspecified: Secondary | ICD-10-CM | POA: Diagnosis not present

## 2022-02-20 DIAGNOSIS — S299XXA Unspecified injury of thorax, initial encounter: Secondary | ICD-10-CM | POA: Diagnosis not present

## 2022-02-20 DIAGNOSIS — M545 Low back pain, unspecified: Secondary | ICD-10-CM | POA: Diagnosis not present

## 2022-02-20 DIAGNOSIS — M2578 Osteophyte, vertebrae: Secondary | ICD-10-CM | POA: Diagnosis not present

## 2022-02-20 DIAGNOSIS — F0390 Unspecified dementia without behavioral disturbance: Secondary | ICD-10-CM | POA: Diagnosis not present

## 2022-02-20 DIAGNOSIS — Z8673 Personal history of transient ischemic attack (TIA), and cerebral infarction without residual deficits: Secondary | ICD-10-CM | POA: Diagnosis not present

## 2022-02-20 DIAGNOSIS — R079 Chest pain, unspecified: Secondary | ICD-10-CM | POA: Diagnosis not present

## 2022-02-20 DIAGNOSIS — M549 Dorsalgia, unspecified: Secondary | ICD-10-CM | POA: Diagnosis not present

## 2022-02-20 DIAGNOSIS — M7989 Other specified soft tissue disorders: Secondary | ICD-10-CM | POA: Diagnosis not present

## 2022-02-20 DIAGNOSIS — W19XXXA Unspecified fall, initial encounter: Secondary | ICD-10-CM | POA: Diagnosis not present

## 2022-02-20 DIAGNOSIS — Z79899 Other long term (current) drug therapy: Secondary | ICD-10-CM | POA: Diagnosis not present

## 2022-02-20 DIAGNOSIS — K449 Diaphragmatic hernia without obstruction or gangrene: Secondary | ICD-10-CM | POA: Diagnosis not present

## 2022-02-20 DIAGNOSIS — S32019A Unspecified fracture of first lumbar vertebra, initial encounter for closed fracture: Secondary | ICD-10-CM | POA: Diagnosis not present

## 2022-02-20 DIAGNOSIS — F32A Depression, unspecified: Secondary | ICD-10-CM | POA: Diagnosis not present

## 2022-02-21 DIAGNOSIS — R410 Disorientation, unspecified: Secondary | ICD-10-CM | POA: Diagnosis not present

## 2022-03-01 DIAGNOSIS — Z09 Encounter for follow-up examination after completed treatment for conditions other than malignant neoplasm: Secondary | ICD-10-CM | POA: Diagnosis not present

## 2022-03-01 DIAGNOSIS — Z6823 Body mass index (BMI) 23.0-23.9, adult: Secondary | ICD-10-CM | POA: Diagnosis not present

## 2022-03-01 DIAGNOSIS — I4891 Unspecified atrial fibrillation: Secondary | ICD-10-CM | POA: Diagnosis not present

## 2022-10-08 ENCOUNTER — Telehealth: Payer: Self-pay | Admitting: Neurology

## 2022-10-08 NOTE — Telephone Encounter (Signed)
Pt's daughter, Katherine Basset (on [place]) would like a call from the nurse to discuss a sooner appt. Because memory loss worsening, answer her a question, she will  turn around and ask again. Even a family member could not remember the name even after telling who the person's name.

## 2022-10-09 DIAGNOSIS — R4189 Other symptoms and signs involving cognitive functions and awareness: Secondary | ICD-10-CM | POA: Diagnosis not present

## 2022-10-09 NOTE — Telephone Encounter (Signed)
Spoke with daughter (checked DPR) Daughter states pt  has increased memory loss pt not able to remember some family members names who she sees on a regular  basis . Daughter wanted sooner appointment with Dr Rexene Alberts  Made appointment for 10/14/2022. Informed daughter since patient is having increased memory loss maybe she should  make appointment with PCP to have her urine tested for any type of infection. Daughtrer hesitant  at first because she really wants to bring patient to [facility]  Informed daughter still can keep appointment in October but this is just to make sure patient doesn't have any type of infection that is causing increased memory loss . Daughter finally agreed to take patient to PCP  and come Monday 10/14/2022 Pt thanked me for calling

## 2022-10-14 ENCOUNTER — Ambulatory Visit (INDEPENDENT_AMBULATORY_CARE_PROVIDER_SITE_OTHER): Payer: Medicare HMO | Admitting: Neurology

## 2022-10-14 ENCOUNTER — Encounter: Payer: Self-pay | Admitting: Neurology

## 2022-10-14 VITALS — BP 108/76 | HR 109 | Ht 60.0 in | Wt 121.8 lb

## 2022-10-14 DIAGNOSIS — F0390 Unspecified dementia without behavioral disturbance: Secondary | ICD-10-CM | POA: Diagnosis not present

## 2022-10-14 NOTE — Progress Notes (Signed)
Subjective:    Patient ID: Sarah Montgomery is a 88 y.o. female.  HPI    Interim history:   Sarah Montgomery is an 88 year old right-handed woman with an underlying medical history of depression, and remote history of hemorrhagic stroke, who presents for follow-up consultation of Sarah Montgomery memory loss after a longer gap of over 1 year.  The patient is accompanied by Sarah Montgomery Sarah Montgomery today.  I first met Sarah Montgomery at the request of Sarah Montgomery primary care physician on 06/28/2021, at which time the patient reported an over 1 year history of forgetfulness and misplacing things.  Memory has become worse over the past year per Sarah Montgomery's report, Sarah Montgomery was on donepezil at the time, 10 mg once daily since April 2022.  Sarah Montgomery MMSE was 20 out of 30 at the time.  We talked about the importance of lifestyle modification including good nutrition and good hydration, I recommended that Sarah Montgomery no longer drive.  Sarah Montgomery canceled an appointment in the interim in January 2023.   Today, 10/14/2022: Sarah Montgomery reports feeling well.  Sarah Montgomery has no new complaints, history is primarily provided by Sarah Montgomery Sarah Montgomery.  Patient indicates that Sarah Montgomery is doing well living by herself, Sarah Montgomery reports that Sarah Montgomery  sister lives close by and checks on the patient about twice a week.  Both daughters who are in town work and there is another Sarah Montgomery that is out of town.  Patient has fallen in the past year and injured herself, Sarah Montgomery sustained a laceration below the eye and sustained a vertebral fracture and rib fractures in March 2023.  Sarah Montgomery had a follow-up appointment with Sarah Montgomery primary care in March 2023 after Sarah Montgomery emergency room visit.  Sarah Montgomery has not seen Sarah Montgomery since then.  No recent blood work.  Sarah Montgomery did have a urine test last week which per Sarah Montgomery was negative for any infection.  Patient has fallen in the bathroom and did not call for help immediately, got up by herself, unclear how long Sarah Montgomery was down in the shower.  No report of loss of consciousness.  Sarah Montgomery went to church the next day and when the  Sarah Montgomery called Sarah Montgomery 2 days later patient did not sound right so Sarah Montgomery Sarah Montgomery took Sarah Montgomery to the emergency room.  Unfortunately, patient has fallen in the past.  Sarah Montgomery canceled an appointment in the interim with [place], Sarah Montgomery continues to take donepezil once daily but takes it in the mornings rather than evenings.  Sarah Montgomery has also skipped some doses, unclear how often this happens, probably for daughters estimate on average once a week as Sarah Montgomery has Sarah Montgomery medications prepackaged and gauging from that  The patient's allergies, current medications, family history, past medical history, past social history, past surgical history and problem list were reviewed and updated as appropriate.   Previously:   06/28/21: (Sarah Montgomery) reports an at least 1 year history of forgetfulness and misplacing things.  Sarah Montgomery reports that memory loss has been more noticeable in the past year.  Of note, Sarah Montgomery was started on donepezil 5 mg strength once daily last year in July.  This was increased to 10 mg once daily in late April 2022.  I reviewed your office note from 04/10/2021.  Sarah Montgomery had blood work through your office at the time including CBC, CMP and lipid panel.  We will request test results from your office. I also reviewed your office note from 07/03/2020, at which time Sarah Montgomery had blood work including CBC, CMP, TSH, vitamin B12, RPR.  We will request test results from your office.  Sarah Montgomery was started on donepezil 5 mg strength at the time.  Prior head CTA according to your records done at [facility] on 11/17/2017 showed old left PCA territory hemorrhagic infarct affecting the medial left occipital lobe.  Dolichoectatic cerebral vasculature without evidence for saccular aneurysm, intracranial stenosis, or occlusion, arteriovenous malformation or other significant findings.  Sarah Montgomery had a carotid Doppler ultrasound also performed at [facility] on 08/15/2015 which, according to your records, did not show any significant internal carotid artery plaques or  stenoses.    Sarah Montgomery was previously suspected to have a brain aneurysm as I understand.  Sarah Montgomery has never had any clipping or surgery.  Per Sarah Montgomery, Sarah Montgomery had a stroke in 2000.  Sarah Montgomery is a non-smoker and does not drink alcohol.  Sarah Montgomery lives alone, has 3 grown daughters.  2 of them live close by.  Sarah Montgomery reports drinking water on a regular basis and hydrating well.  Appetite is good, weight is stable.  Sarah Montgomery has an appointment this month to get Sarah Montgomery hearing aids checked.  Sarah Montgomery did have one of them repaired in May 2022.  Sarah Montgomery is hard of hearing.  History is supplemented by Sarah Montgomery Sarah Montgomery.   Patient denies any family history of dementia.  Sarah Montgomery has been stable on citalopram.  Sarah Montgomery drives, typically to church and locally around town.  Sarah Montgomery reports that Sarah Montgomery has not driven with Sarah Montgomery with the patient driving lately but Sarah Montgomery has driven behind Sarah Montgomery and noticed no significant issues.  They also have a tracker in Sarah Montgomery car.   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  Sarah Montgomery reports an 8th grade education.   Patient has fallen several times by self-report, Sarah Montgomery injured Sarah Montgomery right forehead a couple of years ago when Sarah Montgomery tripped off a curb.  Sarah Montgomery needed stitches.  Sarah Montgomery may have had a CT scan at the time, Sarah Montgomery is not sure.   Sarah Montgomery does not use a walking aid.  Sarah Montgomery has not fallen in the past 6 months.   Sarah Montgomery Past Medical History Is Significant For: Past Medical History:  Diagnosis Date   Cerebral aneurysm    Depression     Sarah Montgomery Past Surgical History Is Significant For: Past Surgical History:  Procedure Laterality Date   brokem wrist     2019    Sarah Montgomery Family History Is Significant For: Family History  Problem Relation Age of Onset   Dementia Neg Hx    Alzheimer's disease Neg Hx     Sarah Montgomery Social History Is Significant For: Social History   Socioeconomic History   Marital status: Married    Spouse name: Not on file   Number of children: Not on file   Years of education: Not on file   Highest education level: Not on file  Occupational History    Not on file  Tobacco Use   Smoking status: Former    Types: Cigarettes   Smokeless tobacco: Never   Tobacco comments:    Quit smoking early 20's      Substance and Sexual Activity   Alcohol use: Never   Drug use: Never   Sexual activity: Not on file  Other Topics Concern   Not on file  Social History Narrative   Lives alone, townhome,  3 children (2 [place], 1 [place]). Retired, 8th grade education.   Caffeine 1 cup daily.    Social Determinants of Health   Financial Resource Strain: Not on file  Food Insecurity: Not on file  Transportation Needs: Not on file  Physical Activity: Not on file  Stress: Not on file  Social Connections: Not on file    Sarah Montgomery Allergies Are:  No Known Allergies:   Sarah Montgomery Current Medications Are:  Outpatient Encounter Medications as of 10/14/2022  Medication Sig   aspirin EC 81 MG tablet Take 81 mg by mouth daily. Swallow whole.   citalopram (CELEXA) 20 MG tablet TAKE 1 TABLET BY MOUTH EVERY DAY FOR DEPRESSION   cyanocobalamin 2000 MCG tablet Take 2,000 mcg by mouth daily.   donepezil (ARICEPT) 10 MG tablet Take 10 mg by mouth at bedtime.   hydrochlorothiazide (HYDRODIURIL) 12.5 MG tablet Take 12.5 mg by mouth daily.   metoprolol tartrate (LOPRESSOR) 25 MG tablet Take 25 mg by mouth daily at 2 PM.   Multiple Vitamins-Minerals (PRESERVISION AREDS PO) Take by mouth. One tablet daily   No facility-administered encounter medications on file as of 10/14/2022.  :  Review of Systems:  Out of a complete 14 point review of systems, all are reviewed and negative with the exception of these symptoms as listed below:   Review of Systems  Neurological:        Pt here for increased memory loss Sarah Montgomery states pt short term memory is worse Sarah Montgomery states pt will ask the same question multiple times Sarah Montgomery states at times  Pt has missed Sarah Montgomery dosage of Aricept  Sarah Montgomery states pt takes Aricept in am     Objective:  Neurological Exam  Physical  Exam Physical Examination:   Vitals:   10/14/22 1509  BP: 108/76  Pulse: (!) 109    General Examination: The patient is a very pleasant 88 y.o. female in no acute distress. Sarah Montgomery appears well-developed and well-nourished and well groomed.   HEENT: Normocephalic, atraumatic, pupils are equal, tracking is well-preserved, hearing is impaired.  Face is symmetric.  Speech without dysarthria but mild low-volume speech.  Neck is supple, no carotid bruits.  Airway examination reveals mild mouth dryness, tongue protrudes centrally and palate elevates symmetrically.   Chest: Clear to auscultation without wheezing, rhonchi or crackles noted.   Heart: S1+S2+0, regular and normal without murmurs, rubs or gallops noted.    Abdomen: Soft, non-tender and non-distended.   Extremities: There is no obvious edema.    Skin: Warm and dry without trophic changes noted.    Musculoskeletal: exam reveals prominent arthritic changes particularly in both hands.    Neurologically:  Mental status: The patient is awake, alert and pays attention. Sarah Montgomery is having difficulty relating Sarah Montgomery history.  Sarah Montgomery is not able to provide details. Thought process is linear. Mood is normal and affect is normal.       10/14/2022    3:12 PM 06/28/2021    7:53 AM  MMSE - Mini Mental State Exam  Orientation to time 2 5  Orientation to Place 2 4  Registration 3 3  Attention/ Calculation 2 0  Recall 1 1  Language- name 2 objects 2 2  Language- repeat 0 0  Language- follow 3 step command 1 3  Language- read & follow direction 0 1  Write a sentence 0 1  Copy design 0 1  Total score 13 21    On 10/14/2022: MMSE: 13/30, CDT: 1.5/4, AFT: 7/min.  On 06/28/2021: MMSE: 20/30 (Sarah Montgomery lost one-point on orientation, one-point on repetition, one-point on drawing intersecting pentagons, 5 points on serial sevens and 2 points on remote recall), CDT: 2/4 (with third attempt at drawing the clock), AFT: 9/min.   Cranial nerves II - XII  are as  described above under HEENT exam.  Unequal shoulder height noted.  Motor exam: Thin bulk, moves all 4 extremities, no obvious tremor.  Romberg is not tested due to safety concerns, sensory exam is intact to light touch.  Cerebellar testing: No dysmetria or intention tremor. Increase in upper body curvature noted. Assessment and Plan:    In summary, Terriann Difonzo is a very pleasant 88 year old female with an underlying medical history of depression, and remote history of hemorrhagic stroke, who presents for evaluation of Sarah Montgomery memory loss of at least 2-2.5 years' duration.  Sarah Montgomery memory scores have declined compared to July 2022.  Sarah Montgomery likely has dementia without behavioral disturbance.  Sarah Montgomery does not have a telltale family history of Alzheimer's dementia.  Unfortunately, Sarah Montgomery has fallen, Sarah Montgomery has injured herself, Sarah Montgomery sustained rib fractures in March 2023.  Sarah Montgomery no longer drives a car.  I am worried about Sarah Montgomery fall risk and Sarah Montgomery prior injuries, Sarah Montgomery continues to live alone. Sarah Montgomery has support from Sarah Montgomery 2 daughters that live locally. I talked to the patient and Sarah Montgomery Sarah Montgomery about my concern regarding Sarah Montgomery living situation.  I would recommend that Sarah Montgomery no longer live by herself, I would recommend that Sarah Montgomery talk with Sarah Montgomery family and Sarah Montgomery primary care about possibilities such as transitioning to an assisted living facility that may also offer memory care for future consideration.  Sarah Montgomery insurance denied a brain MRI.  Sarah Montgomery had a head CT without contrast on 07/23/2021 which showed a remote left occipital stroke with dystrophic calcification, chronic microvascular ischemic change, mild generalized cortical atrophy, deemed typical for age.  We talked about the possibility of utilizing a second memory medication but my concern is for example with Namenda that Sarah Montgomery might have side effects, increased risk for falls and hallucinations and if Sarah Montgomery were to fall Sarah Montgomery may not have ways to get up or call for help.  They will look into a life alert  button, Sarah Montgomery is advised to make an appointment with Sarah Montgomery primary care also to discuss these challenges and primary care input into long-term living situation.  Patient is advised to no longer cook at the stove.  Sarah Montgomery is advised to be consistent with Sarah Montgomery donepezil and take it in the evening every day.  Sarah Montgomery is advised to make a follow-up appointment after Sarah Montgomery has had a chance to see Sarah Montgomery primary care.  I answered all their questions today and the patient and Sarah Montgomery Sarah Montgomery were in agreement. I spent 30 minutes in total face-to-face time and in reviewing records during pre-charting, more than 50% of which was spent in counseling and coordination of care, reviewing test results, reviewing medications and treatment regimen and/or in discussing or reviewing the diagnosis of memory loss, the prognosis and treatment options. Pertinent laboratory and imaging test results that were available during this visit with the patient were reviewed by me and considered in my medical decision making (see chart for details).

## 2022-10-14 NOTE — Patient Instructions (Signed)
It was nice to see you again today. As discussed, as of right now, I do not believe you are fully safe to live by yourself, I would recommend full supervision 24-7 for your own safety and supervision of your medications as you have fallen and injured yourself. You have fallen twice within the past year with injuries. For now, I recommend that you continue with your current memory medication, please take donepezil at night rather than during the day.   Before we consider adding a second memory medication, I would recommend that you talk with your family and your primary care about longer-term living situation such as transitioning to assisted living or having someone stay with you to stay with a family member, as I do believe that first and foremost you would benefit from supervision and the possibility of getting help quicker if need be.  You have fallen before and did not seek medical attention immediately.  This can be risky if you fall and hit your head.  Memory medication such as Namenda can increase your risk for dizziness, balance issues, and hallucinations. I do not recommend that you use the stove for cooking.  Since you have not seen your primary care in over 6 months, I recommend that you make a follow-up appointment with your primary care, Dr. Helene Kelp for a full checkup, including blood work and to get her input and recommendations as far as your long-term living situation.  We can make a follow-up appointment with [place] after that.

## 2022-11-27 ENCOUNTER — Ambulatory Visit: Payer: Medicare HMO | Admitting: Neurology

## 2023-02-19 DIAGNOSIS — H52223 Regular astigmatism, bilateral: Secondary | ICD-10-CM | POA: Diagnosis not present

## 2023-02-19 DIAGNOSIS — H35343 Macular cyst, hole, or pseudohole, bilateral: Secondary | ICD-10-CM | POA: Diagnosis not present

## 2023-02-19 DIAGNOSIS — H26491 Other secondary cataract, right eye: Secondary | ICD-10-CM | POA: Diagnosis not present

## 2023-02-19 DIAGNOSIS — Z961 Presence of intraocular lens: Secondary | ICD-10-CM | POA: Diagnosis not present

## 2023-02-19 DIAGNOSIS — H353131 Nonexudative age-related macular degeneration, bilateral, early dry stage: Secondary | ICD-10-CM | POA: Diagnosis not present

## 2023-04-01 DIAGNOSIS — Z0000 Encounter for general adult medical examination without abnormal findings: Secondary | ICD-10-CM | POA: Diagnosis not present

## 2023-04-01 DIAGNOSIS — Z6824 Body mass index (BMI) 24.0-24.9, adult: Secondary | ICD-10-CM | POA: Diagnosis not present

## 2023-04-01 DIAGNOSIS — F32A Depression, unspecified: Secondary | ICD-10-CM | POA: Diagnosis not present

## 2023-04-01 DIAGNOSIS — L989 Disorder of the skin and subcutaneous tissue, unspecified: Secondary | ICD-10-CM | POA: Diagnosis not present

## 2023-04-01 DIAGNOSIS — I4891 Unspecified atrial fibrillation: Secondary | ICD-10-CM | POA: Diagnosis not present

## 2023-04-01 DIAGNOSIS — Z79899 Other long term (current) drug therapy: Secondary | ICD-10-CM | POA: Diagnosis not present

## 2023-04-01 DIAGNOSIS — E785 Hyperlipidemia, unspecified: Secondary | ICD-10-CM | POA: Diagnosis not present

## 2023-04-01 DIAGNOSIS — L089 Local infection of the skin and subcutaneous tissue, unspecified: Secondary | ICD-10-CM | POA: Diagnosis not present

## 2023-04-01 DIAGNOSIS — L723 Sebaceous cyst: Secondary | ICD-10-CM | POA: Diagnosis not present

## 2023-04-02 DIAGNOSIS — L723 Sebaceous cyst: Secondary | ICD-10-CM | POA: Diagnosis not present

## 2023-04-04 DIAGNOSIS — L089 Local infection of the skin and subcutaneous tissue, unspecified: Secondary | ICD-10-CM | POA: Diagnosis not present

## 2023-04-04 DIAGNOSIS — L723 Sebaceous cyst: Secondary | ICD-10-CM | POA: Diagnosis not present

## 2023-04-08 DIAGNOSIS — L821 Other seborrheic keratosis: Secondary | ICD-10-CM | POA: Diagnosis not present

## 2023-04-08 DIAGNOSIS — Z85828 Personal history of other malignant neoplasm of skin: Secondary | ICD-10-CM | POA: Diagnosis not present

## 2023-04-22 DIAGNOSIS — L089 Local infection of the skin and subcutaneous tissue, unspecified: Secondary | ICD-10-CM | POA: Diagnosis not present

## 2023-04-22 DIAGNOSIS — L723 Sebaceous cyst: Secondary | ICD-10-CM | POA: Diagnosis not present

## 2023-08-19 DIAGNOSIS — L821 Other seborrheic keratosis: Secondary | ICD-10-CM | POA: Diagnosis not present

## 2023-08-19 DIAGNOSIS — L578 Other skin changes due to chronic exposure to nonionizing radiation: Secondary | ICD-10-CM | POA: Diagnosis not present

## 2023-08-19 DIAGNOSIS — L820 Inflamed seborrheic keratosis: Secondary | ICD-10-CM | POA: Diagnosis not present

## 2023-09-11 DIAGNOSIS — F03911 Unspecified dementia, unspecified severity, with agitation: Secondary | ICD-10-CM | POA: Diagnosis not present

## 2023-09-11 DIAGNOSIS — Z6823 Body mass index (BMI) 23.0-23.9, adult: Secondary | ICD-10-CM | POA: Diagnosis not present

## 2023-10-05 DIAGNOSIS — S0181XA Laceration without foreign body of other part of head, initial encounter: Secondary | ICD-10-CM | POA: Diagnosis not present

## 2023-10-05 DIAGNOSIS — W19XXXA Unspecified fall, initial encounter: Secondary | ICD-10-CM | POA: Diagnosis not present

## 2023-10-05 DIAGNOSIS — M898X8 Other specified disorders of bone, other site: Secondary | ICD-10-CM | POA: Diagnosis not present

## 2023-10-05 DIAGNOSIS — R930 Abnormal findings on diagnostic imaging of skull and head, not elsewhere classified: Secondary | ICD-10-CM | POA: Diagnosis not present

## 2023-10-05 DIAGNOSIS — S0990XA Unspecified injury of head, initial encounter: Secondary | ICD-10-CM | POA: Diagnosis not present

## 2023-10-05 DIAGNOSIS — F0390 Unspecified dementia without behavioral disturbance: Secondary | ICD-10-CM | POA: Diagnosis not present

## 2023-10-05 DIAGNOSIS — R58 Hemorrhage, not elsewhere classified: Secondary | ICD-10-CM | POA: Diagnosis not present

## 2023-10-05 DIAGNOSIS — W182XXA Fall in (into) shower or empty bathtub, initial encounter: Secondary | ICD-10-CM | POA: Diagnosis not present

## 2023-10-05 DIAGNOSIS — Z043 Encounter for examination and observation following other accident: Secondary | ICD-10-CM | POA: Diagnosis not present

## 2023-10-05 DIAGNOSIS — S0101XA Laceration without foreign body of scalp, initial encounter: Secondary | ICD-10-CM | POA: Diagnosis not present

## 2023-10-05 DIAGNOSIS — Z8659 Personal history of other mental and behavioral disorders: Secondary | ICD-10-CM | POA: Diagnosis not present

## 2023-10-06 DIAGNOSIS — I771 Stricture of artery: Secondary | ICD-10-CM | POA: Diagnosis not present

## 2023-10-06 DIAGNOSIS — R918 Other nonspecific abnormal finding of lung field: Secondary | ICD-10-CM | POA: Diagnosis not present

## 2023-10-06 DIAGNOSIS — I2510 Atherosclerotic heart disease of native coronary artery without angina pectoris: Secondary | ICD-10-CM | POA: Diagnosis not present

## 2023-10-06 DIAGNOSIS — I6782 Cerebral ischemia: Secondary | ICD-10-CM | POA: Diagnosis not present

## 2023-10-06 DIAGNOSIS — I4891 Unspecified atrial fibrillation: Secondary | ICD-10-CM | POA: Diagnosis not present

## 2023-10-06 DIAGNOSIS — S0990XA Unspecified injury of head, initial encounter: Secondary | ICD-10-CM | POA: Diagnosis not present

## 2023-10-06 DIAGNOSIS — R413 Other amnesia: Secondary | ICD-10-CM | POA: Diagnosis not present

## 2023-10-06 DIAGNOSIS — R531 Weakness: Secondary | ICD-10-CM | POA: Diagnosis not present

## 2023-10-06 DIAGNOSIS — I499 Cardiac arrhythmia, unspecified: Secondary | ICD-10-CM | POA: Diagnosis not present

## 2023-10-06 DIAGNOSIS — E876 Hypokalemia: Secondary | ICD-10-CM | POA: Diagnosis not present

## 2023-10-06 DIAGNOSIS — Z66 Do not resuscitate: Secondary | ICD-10-CM | POA: Diagnosis not present

## 2023-10-06 DIAGNOSIS — F03911 Unspecified dementia, unspecified severity, with agitation: Secondary | ICD-10-CM | POA: Diagnosis not present

## 2023-10-06 DIAGNOSIS — J189 Pneumonia, unspecified organism: Secondary | ICD-10-CM | POA: Diagnosis not present

## 2023-10-06 DIAGNOSIS — G9389 Other specified disorders of brain: Secondary | ICD-10-CM | POA: Diagnosis not present

## 2023-10-06 DIAGNOSIS — J181 Lobar pneumonia, unspecified organism: Secondary | ICD-10-CM | POA: Diagnosis not present

## 2023-10-06 DIAGNOSIS — K449 Diaphragmatic hernia without obstruction or gangrene: Secondary | ICD-10-CM | POA: Diagnosis not present

## 2023-10-06 DIAGNOSIS — F0393 Unspecified dementia, unspecified severity, with mood disturbance: Secondary | ICD-10-CM | POA: Diagnosis not present

## 2023-10-06 DIAGNOSIS — F32A Depression, unspecified: Secondary | ICD-10-CM | POA: Diagnosis not present

## 2023-10-06 DIAGNOSIS — I517 Cardiomegaly: Secondary | ICD-10-CM | POA: Diagnosis not present

## 2023-10-06 DIAGNOSIS — W19XXXS Unspecified fall, sequela: Secondary | ICD-10-CM | POA: Diagnosis not present

## 2023-10-06 DIAGNOSIS — Y92009 Unspecified place in unspecified non-institutional (private) residence as the place of occurrence of the external cause: Secondary | ICD-10-CM | POA: Diagnosis not present

## 2023-10-06 DIAGNOSIS — I700 Atherosclerosis of aorta: Secondary | ICD-10-CM | POA: Diagnosis not present

## 2023-10-06 DIAGNOSIS — J90 Pleural effusion, not elsewhere classified: Secondary | ICD-10-CM | POA: Diagnosis not present

## 2023-10-06 DIAGNOSIS — R059 Cough, unspecified: Secondary | ICD-10-CM | POA: Diagnosis not present

## 2023-10-06 DIAGNOSIS — Z20822 Contact with and (suspected) exposure to covid-19: Secondary | ICD-10-CM | POA: Diagnosis not present

## 2023-10-06 DIAGNOSIS — J168 Pneumonia due to other specified infectious organisms: Secondary | ICD-10-CM | POA: Diagnosis not present

## 2023-10-06 DIAGNOSIS — I10 Essential (primary) hypertension: Secondary | ICD-10-CM | POA: Diagnosis not present

## 2023-10-06 DIAGNOSIS — E871 Hypo-osmolality and hyponatremia: Secondary | ICD-10-CM | POA: Diagnosis not present

## 2023-10-10 DIAGNOSIS — R262 Difficulty in walking, not elsewhere classified: Secondary | ICD-10-CM | POA: Diagnosis not present

## 2023-10-10 DIAGNOSIS — J181 Lobar pneumonia, unspecified organism: Secondary | ICD-10-CM | POA: Diagnosis not present

## 2023-10-10 DIAGNOSIS — I119 Hypertensive heart disease without heart failure: Secondary | ICD-10-CM | POA: Diagnosis not present

## 2023-10-10 DIAGNOSIS — F0390 Unspecified dementia without behavioral disturbance: Secondary | ICD-10-CM | POA: Diagnosis not present

## 2023-10-10 DIAGNOSIS — J189 Pneumonia, unspecified organism: Secondary | ICD-10-CM | POA: Diagnosis not present

## 2023-10-13 DIAGNOSIS — I119 Hypertensive heart disease without heart failure: Secondary | ICD-10-CM | POA: Diagnosis not present

## 2023-10-13 DIAGNOSIS — J189 Pneumonia, unspecified organism: Secondary | ICD-10-CM | POA: Diagnosis not present

## 2023-10-13 DIAGNOSIS — R262 Difficulty in walking, not elsewhere classified: Secondary | ICD-10-CM | POA: Diagnosis not present

## 2023-10-13 DIAGNOSIS — F0390 Unspecified dementia without behavioral disturbance: Secondary | ICD-10-CM | POA: Diagnosis not present

## 2023-10-17 DIAGNOSIS — J189 Pneumonia, unspecified organism: Secondary | ICD-10-CM | POA: Diagnosis not present

## 2023-10-23 DIAGNOSIS — F0283 Dementia in other diseases classified elsewhere, unspecified severity, with mood disturbance: Secondary | ICD-10-CM | POA: Diagnosis not present

## 2023-10-23 DIAGNOSIS — F02818 Dementia in other diseases classified elsewhere, unspecified severity, with other behavioral disturbance: Secondary | ICD-10-CM | POA: Diagnosis not present

## 2023-10-23 DIAGNOSIS — F0284 Dementia in other diseases classified elsewhere, unspecified severity, with anxiety: Secondary | ICD-10-CM | POA: Diagnosis not present

## 2023-10-23 DIAGNOSIS — D649 Anemia, unspecified: Secondary | ICD-10-CM | POA: Diagnosis not present

## 2023-10-23 DIAGNOSIS — F05 Delirium due to known physiological condition: Secondary | ICD-10-CM | POA: Diagnosis not present

## 2023-10-23 DIAGNOSIS — I739 Peripheral vascular disease, unspecified: Secondary | ICD-10-CM | POA: Diagnosis not present

## 2023-10-23 DIAGNOSIS — F32A Depression, unspecified: Secondary | ICD-10-CM | POA: Diagnosis not present

## 2023-10-23 DIAGNOSIS — G319 Degenerative disease of nervous system, unspecified: Secondary | ICD-10-CM | POA: Diagnosis not present

## 2023-10-23 DIAGNOSIS — I119 Hypertensive heart disease without heart failure: Secondary | ICD-10-CM | POA: Diagnosis not present

## 2023-10-27 DIAGNOSIS — I119 Hypertensive heart disease without heart failure: Secondary | ICD-10-CM | POA: Diagnosis not present

## 2023-10-27 DIAGNOSIS — F0283 Dementia in other diseases classified elsewhere, unspecified severity, with mood disturbance: Secondary | ICD-10-CM | POA: Diagnosis not present

## 2023-10-27 DIAGNOSIS — I739 Peripheral vascular disease, unspecified: Secondary | ICD-10-CM | POA: Diagnosis not present

## 2023-10-27 DIAGNOSIS — F0284 Dementia in other diseases classified elsewhere, unspecified severity, with anxiety: Secondary | ICD-10-CM | POA: Diagnosis not present

## 2023-10-27 DIAGNOSIS — F05 Delirium due to known physiological condition: Secondary | ICD-10-CM | POA: Diagnosis not present

## 2023-10-27 DIAGNOSIS — D649 Anemia, unspecified: Secondary | ICD-10-CM | POA: Diagnosis not present

## 2023-10-27 DIAGNOSIS — G319 Degenerative disease of nervous system, unspecified: Secondary | ICD-10-CM | POA: Diagnosis not present

## 2023-10-27 DIAGNOSIS — F02818 Dementia in other diseases classified elsewhere, unspecified severity, with other behavioral disturbance: Secondary | ICD-10-CM | POA: Diagnosis not present

## 2023-10-27 DIAGNOSIS — F32A Depression, unspecified: Secondary | ICD-10-CM | POA: Diagnosis not present

## 2023-10-30 DIAGNOSIS — H6120 Impacted cerumen, unspecified ear: Secondary | ICD-10-CM | POA: Diagnosis not present

## 2023-10-30 DIAGNOSIS — I119 Hypertensive heart disease without heart failure: Secondary | ICD-10-CM | POA: Diagnosis not present

## 2023-10-30 DIAGNOSIS — G319 Degenerative disease of nervous system, unspecified: Secondary | ICD-10-CM | POA: Diagnosis not present

## 2023-10-30 DIAGNOSIS — F0283 Dementia in other diseases classified elsewhere, unspecified severity, with mood disturbance: Secondary | ICD-10-CM | POA: Diagnosis not present

## 2023-10-30 DIAGNOSIS — Z6823 Body mass index (BMI) 23.0-23.9, adult: Secondary | ICD-10-CM | POA: Diagnosis not present

## 2023-10-30 DIAGNOSIS — F32A Depression, unspecified: Secondary | ICD-10-CM | POA: Diagnosis not present

## 2023-10-31 DIAGNOSIS — F02818 Dementia in other diseases classified elsewhere, unspecified severity, with other behavioral disturbance: Secondary | ICD-10-CM | POA: Diagnosis not present

## 2023-10-31 DIAGNOSIS — F05 Delirium due to known physiological condition: Secondary | ICD-10-CM | POA: Diagnosis not present

## 2023-10-31 DIAGNOSIS — F0283 Dementia in other diseases classified elsewhere, unspecified severity, with mood disturbance: Secondary | ICD-10-CM | POA: Diagnosis not present

## 2023-10-31 DIAGNOSIS — I739 Peripheral vascular disease, unspecified: Secondary | ICD-10-CM | POA: Diagnosis not present

## 2023-10-31 DIAGNOSIS — F32A Depression, unspecified: Secondary | ICD-10-CM | POA: Diagnosis not present

## 2023-10-31 DIAGNOSIS — D649 Anemia, unspecified: Secondary | ICD-10-CM | POA: Diagnosis not present

## 2023-10-31 DIAGNOSIS — I119 Hypertensive heart disease without heart failure: Secondary | ICD-10-CM | POA: Diagnosis not present

## 2023-10-31 DIAGNOSIS — F0284 Dementia in other diseases classified elsewhere, unspecified severity, with anxiety: Secondary | ICD-10-CM | POA: Diagnosis not present

## 2023-10-31 DIAGNOSIS — G319 Degenerative disease of nervous system, unspecified: Secondary | ICD-10-CM | POA: Diagnosis not present

## 2023-11-03 DIAGNOSIS — D649 Anemia, unspecified: Secondary | ICD-10-CM | POA: Diagnosis not present

## 2023-11-03 DIAGNOSIS — F0284 Dementia in other diseases classified elsewhere, unspecified severity, with anxiety: Secondary | ICD-10-CM | POA: Diagnosis not present

## 2023-11-03 DIAGNOSIS — F05 Delirium due to known physiological condition: Secondary | ICD-10-CM | POA: Diagnosis not present

## 2023-11-03 DIAGNOSIS — I119 Hypertensive heart disease without heart failure: Secondary | ICD-10-CM | POA: Diagnosis not present

## 2023-11-03 DIAGNOSIS — F0283 Dementia in other diseases classified elsewhere, unspecified severity, with mood disturbance: Secondary | ICD-10-CM | POA: Diagnosis not present

## 2023-11-03 DIAGNOSIS — G319 Degenerative disease of nervous system, unspecified: Secondary | ICD-10-CM | POA: Diagnosis not present

## 2023-11-03 DIAGNOSIS — I739 Peripheral vascular disease, unspecified: Secondary | ICD-10-CM | POA: Diagnosis not present

## 2023-11-03 DIAGNOSIS — F32A Depression, unspecified: Secondary | ICD-10-CM | POA: Diagnosis not present

## 2023-11-03 DIAGNOSIS — F02818 Dementia in other diseases classified elsewhere, unspecified severity, with other behavioral disturbance: Secondary | ICD-10-CM | POA: Diagnosis not present

## 2023-11-05 DIAGNOSIS — G319 Degenerative disease of nervous system, unspecified: Secondary | ICD-10-CM | POA: Diagnosis not present

## 2023-11-05 DIAGNOSIS — F05 Delirium due to known physiological condition: Secondary | ICD-10-CM | POA: Diagnosis not present

## 2023-11-05 DIAGNOSIS — F02818 Dementia in other diseases classified elsewhere, unspecified severity, with other behavioral disturbance: Secondary | ICD-10-CM | POA: Diagnosis not present

## 2023-11-05 DIAGNOSIS — F0283 Dementia in other diseases classified elsewhere, unspecified severity, with mood disturbance: Secondary | ICD-10-CM | POA: Diagnosis not present

## 2023-11-05 DIAGNOSIS — F0284 Dementia in other diseases classified elsewhere, unspecified severity, with anxiety: Secondary | ICD-10-CM | POA: Diagnosis not present

## 2023-11-05 DIAGNOSIS — D649 Anemia, unspecified: Secondary | ICD-10-CM | POA: Diagnosis not present

## 2023-11-05 DIAGNOSIS — I739 Peripheral vascular disease, unspecified: Secondary | ICD-10-CM | POA: Diagnosis not present

## 2023-11-05 DIAGNOSIS — F32A Depression, unspecified: Secondary | ICD-10-CM | POA: Diagnosis not present

## 2023-11-05 DIAGNOSIS — I119 Hypertensive heart disease without heart failure: Secondary | ICD-10-CM | POA: Diagnosis not present

## 2023-11-07 DIAGNOSIS — F0283 Dementia in other diseases classified elsewhere, unspecified severity, with mood disturbance: Secondary | ICD-10-CM | POA: Diagnosis not present

## 2023-11-07 DIAGNOSIS — F05 Delirium due to known physiological condition: Secondary | ICD-10-CM | POA: Diagnosis not present

## 2023-11-07 DIAGNOSIS — F32A Depression, unspecified: Secondary | ICD-10-CM | POA: Diagnosis not present

## 2023-11-07 DIAGNOSIS — F0284 Dementia in other diseases classified elsewhere, unspecified severity, with anxiety: Secondary | ICD-10-CM | POA: Diagnosis not present

## 2023-11-07 DIAGNOSIS — I119 Hypertensive heart disease without heart failure: Secondary | ICD-10-CM | POA: Diagnosis not present

## 2023-11-07 DIAGNOSIS — D649 Anemia, unspecified: Secondary | ICD-10-CM | POA: Diagnosis not present

## 2023-11-07 DIAGNOSIS — G319 Degenerative disease of nervous system, unspecified: Secondary | ICD-10-CM | POA: Diagnosis not present

## 2023-11-07 DIAGNOSIS — I739 Peripheral vascular disease, unspecified: Secondary | ICD-10-CM | POA: Diagnosis not present

## 2023-11-07 DIAGNOSIS — F02818 Dementia in other diseases classified elsewhere, unspecified severity, with other behavioral disturbance: Secondary | ICD-10-CM | POA: Diagnosis not present

## 2023-11-10 DIAGNOSIS — I119 Hypertensive heart disease without heart failure: Secondary | ICD-10-CM | POA: Diagnosis not present

## 2023-11-10 DIAGNOSIS — F32A Depression, unspecified: Secondary | ICD-10-CM | POA: Diagnosis not present

## 2023-11-10 DIAGNOSIS — F0283 Dementia in other diseases classified elsewhere, unspecified severity, with mood disturbance: Secondary | ICD-10-CM | POA: Diagnosis not present

## 2023-11-10 DIAGNOSIS — F05 Delirium due to known physiological condition: Secondary | ICD-10-CM | POA: Diagnosis not present

## 2023-11-10 DIAGNOSIS — G319 Degenerative disease of nervous system, unspecified: Secondary | ICD-10-CM | POA: Diagnosis not present

## 2023-11-10 DIAGNOSIS — F0284 Dementia in other diseases classified elsewhere, unspecified severity, with anxiety: Secondary | ICD-10-CM | POA: Diagnosis not present

## 2023-11-10 DIAGNOSIS — I739 Peripheral vascular disease, unspecified: Secondary | ICD-10-CM | POA: Diagnosis not present

## 2023-11-10 DIAGNOSIS — D649 Anemia, unspecified: Secondary | ICD-10-CM | POA: Diagnosis not present

## 2023-11-10 DIAGNOSIS — F02818 Dementia in other diseases classified elsewhere, unspecified severity, with other behavioral disturbance: Secondary | ICD-10-CM | POA: Diagnosis not present

## 2023-11-17 DIAGNOSIS — F0283 Dementia in other diseases classified elsewhere, unspecified severity, with mood disturbance: Secondary | ICD-10-CM | POA: Diagnosis not present

## 2023-11-17 DIAGNOSIS — I119 Hypertensive heart disease without heart failure: Secondary | ICD-10-CM | POA: Diagnosis not present

## 2023-11-17 DIAGNOSIS — F0284 Dementia in other diseases classified elsewhere, unspecified severity, with anxiety: Secondary | ICD-10-CM | POA: Diagnosis not present

## 2023-11-17 DIAGNOSIS — D649 Anemia, unspecified: Secondary | ICD-10-CM | POA: Diagnosis not present

## 2023-11-17 DIAGNOSIS — F05 Delirium due to known physiological condition: Secondary | ICD-10-CM | POA: Diagnosis not present

## 2023-11-17 DIAGNOSIS — F32A Depression, unspecified: Secondary | ICD-10-CM | POA: Diagnosis not present

## 2023-11-17 DIAGNOSIS — G319 Degenerative disease of nervous system, unspecified: Secondary | ICD-10-CM | POA: Diagnosis not present

## 2023-11-17 DIAGNOSIS — I739 Peripheral vascular disease, unspecified: Secondary | ICD-10-CM | POA: Diagnosis not present

## 2023-11-17 DIAGNOSIS — F02818 Dementia in other diseases classified elsewhere, unspecified severity, with other behavioral disturbance: Secondary | ICD-10-CM | POA: Diagnosis not present

## 2023-11-18 DIAGNOSIS — D649 Anemia, unspecified: Secondary | ICD-10-CM | POA: Diagnosis not present

## 2023-11-18 DIAGNOSIS — I119 Hypertensive heart disease without heart failure: Secondary | ICD-10-CM | POA: Diagnosis not present

## 2023-11-18 DIAGNOSIS — G319 Degenerative disease of nervous system, unspecified: Secondary | ICD-10-CM | POA: Diagnosis not present

## 2023-11-18 DIAGNOSIS — F0284 Dementia in other diseases classified elsewhere, unspecified severity, with anxiety: Secondary | ICD-10-CM | POA: Diagnosis not present

## 2023-11-18 DIAGNOSIS — F32A Depression, unspecified: Secondary | ICD-10-CM | POA: Diagnosis not present

## 2023-11-18 DIAGNOSIS — F02818 Dementia in other diseases classified elsewhere, unspecified severity, with other behavioral disturbance: Secondary | ICD-10-CM | POA: Diagnosis not present

## 2023-11-18 DIAGNOSIS — F0283 Dementia in other diseases classified elsewhere, unspecified severity, with mood disturbance: Secondary | ICD-10-CM | POA: Diagnosis not present

## 2023-11-18 DIAGNOSIS — I739 Peripheral vascular disease, unspecified: Secondary | ICD-10-CM | POA: Diagnosis not present

## 2023-11-18 DIAGNOSIS — F05 Delirium due to known physiological condition: Secondary | ICD-10-CM | POA: Diagnosis not present

## 2023-12-01 DIAGNOSIS — F32A Depression, unspecified: Secondary | ICD-10-CM | POA: Diagnosis not present

## 2023-12-01 DIAGNOSIS — F02818 Dementia in other diseases classified elsewhere, unspecified severity, with other behavioral disturbance: Secondary | ICD-10-CM | POA: Diagnosis not present

## 2023-12-01 DIAGNOSIS — G319 Degenerative disease of nervous system, unspecified: Secondary | ICD-10-CM | POA: Diagnosis not present

## 2023-12-01 DIAGNOSIS — I119 Hypertensive heart disease without heart failure: Secondary | ICD-10-CM | POA: Diagnosis not present

## 2023-12-01 DIAGNOSIS — F0284 Dementia in other diseases classified elsewhere, unspecified severity, with anxiety: Secondary | ICD-10-CM | POA: Diagnosis not present

## 2023-12-01 DIAGNOSIS — D649 Anemia, unspecified: Secondary | ICD-10-CM | POA: Diagnosis not present

## 2023-12-01 DIAGNOSIS — F0283 Dementia in other diseases classified elsewhere, unspecified severity, with mood disturbance: Secondary | ICD-10-CM | POA: Diagnosis not present

## 2023-12-01 DIAGNOSIS — F05 Delirium due to known physiological condition: Secondary | ICD-10-CM | POA: Diagnosis not present

## 2023-12-01 DIAGNOSIS — I739 Peripheral vascular disease, unspecified: Secondary | ICD-10-CM | POA: Diagnosis not present

## 2023-12-15 DIAGNOSIS — I739 Peripheral vascular disease, unspecified: Secondary | ICD-10-CM | POA: Diagnosis not present

## 2023-12-15 DIAGNOSIS — I119 Hypertensive heart disease without heart failure: Secondary | ICD-10-CM | POA: Diagnosis not present

## 2023-12-15 DIAGNOSIS — G319 Degenerative disease of nervous system, unspecified: Secondary | ICD-10-CM | POA: Diagnosis not present

## 2023-12-15 DIAGNOSIS — D649 Anemia, unspecified: Secondary | ICD-10-CM | POA: Diagnosis not present

## 2023-12-15 DIAGNOSIS — F0284 Dementia in other diseases classified elsewhere, unspecified severity, with anxiety: Secondary | ICD-10-CM | POA: Diagnosis not present

## 2023-12-15 DIAGNOSIS — F02818 Dementia in other diseases classified elsewhere, unspecified severity, with other behavioral disturbance: Secondary | ICD-10-CM | POA: Diagnosis not present

## 2023-12-15 DIAGNOSIS — F05 Delirium due to known physiological condition: Secondary | ICD-10-CM | POA: Diagnosis not present

## 2023-12-15 DIAGNOSIS — F32A Depression, unspecified: Secondary | ICD-10-CM | POA: Diagnosis not present

## 2023-12-15 DIAGNOSIS — F0283 Dementia in other diseases classified elsewhere, unspecified severity, with mood disturbance: Secondary | ICD-10-CM | POA: Diagnosis not present

## 2024-01-28 DIAGNOSIS — R300 Dysuria: Secondary | ICD-10-CM | POA: Diagnosis not present

## 2024-01-28 DIAGNOSIS — Z6825 Body mass index (BMI) 25.0-25.9, adult: Secondary | ICD-10-CM | POA: Diagnosis not present

## 2024-03-29 DIAGNOSIS — H52223 Regular astigmatism, bilateral: Secondary | ICD-10-CM | POA: Diagnosis not present

## 2024-03-29 DIAGNOSIS — H524 Presbyopia: Secondary | ICD-10-CM | POA: Diagnosis not present

## 2024-03-29 DIAGNOSIS — Z961 Presence of intraocular lens: Secondary | ICD-10-CM | POA: Diagnosis not present

## 2024-03-29 DIAGNOSIS — H353133 Nonexudative age-related macular degeneration, bilateral, advanced atrophic without subfoveal involvement: Secondary | ICD-10-CM | POA: Diagnosis not present

## 2024-03-29 DIAGNOSIS — H35343 Macular cyst, hole, or pseudohole, bilateral: Secondary | ICD-10-CM | POA: Diagnosis not present

## 2024-03-29 DIAGNOSIS — H26491 Other secondary cataract, right eye: Secondary | ICD-10-CM | POA: Diagnosis not present

## 2024-04-25 DIAGNOSIS — M549 Dorsalgia, unspecified: Secondary | ICD-10-CM | POA: Diagnosis not present

## 2024-04-25 DIAGNOSIS — F0390 Unspecified dementia without behavioral disturbance: Secondary | ICD-10-CM | POA: Diagnosis not present

## 2024-04-25 DIAGNOSIS — I482 Chronic atrial fibrillation, unspecified: Secondary | ICD-10-CM | POA: Diagnosis not present

## 2024-04-25 DIAGNOSIS — S32030A Wedge compression fracture of third lumbar vertebra, initial encounter for closed fracture: Secondary | ICD-10-CM | POA: Diagnosis not present

## 2024-04-25 DIAGNOSIS — Z7982 Long term (current) use of aspirin: Secondary | ICD-10-CM | POA: Diagnosis not present

## 2024-04-25 DIAGNOSIS — I4891 Unspecified atrial fibrillation: Secondary | ICD-10-CM | POA: Diagnosis not present

## 2024-04-25 DIAGNOSIS — Z79899 Other long term (current) drug therapy: Secondary | ICD-10-CM | POA: Diagnosis not present

## 2024-04-25 DIAGNOSIS — W19XXXA Unspecified fall, initial encounter: Secondary | ICD-10-CM | POA: Diagnosis not present

## 2024-04-27 DIAGNOSIS — R051 Acute cough: Secondary | ICD-10-CM | POA: Diagnosis not present

## 2024-04-27 DIAGNOSIS — M545 Low back pain, unspecified: Secondary | ICD-10-CM | POA: Diagnosis not present

## 2024-04-27 DIAGNOSIS — Z6825 Body mass index (BMI) 25.0-25.9, adult: Secondary | ICD-10-CM | POA: Diagnosis not present

## 2024-04-29 DIAGNOSIS — I4891 Unspecified atrial fibrillation: Secondary | ICD-10-CM | POA: Diagnosis not present

## 2024-04-29 DIAGNOSIS — Z6825 Body mass index (BMI) 25.0-25.9, adult: Secondary | ICD-10-CM | POA: Diagnosis not present

## 2024-04-29 DIAGNOSIS — F03911 Unspecified dementia, unspecified severity, with agitation: Secondary | ICD-10-CM | POA: Diagnosis not present

## 2024-04-29 DIAGNOSIS — Z1339 Encounter for screening examination for other mental health and behavioral disorders: Secondary | ICD-10-CM | POA: Diagnosis not present

## 2024-04-29 DIAGNOSIS — E785 Hyperlipidemia, unspecified: Secondary | ICD-10-CM | POA: Diagnosis not present

## 2024-04-29 DIAGNOSIS — Z1331 Encounter for screening for depression: Secondary | ICD-10-CM | POA: Diagnosis not present

## 2024-04-29 DIAGNOSIS — Z0000 Encounter for general adult medical examination without abnormal findings: Secondary | ICD-10-CM | POA: Diagnosis not present

## 2024-04-29 DIAGNOSIS — Z79899 Other long term (current) drug therapy: Secondary | ICD-10-CM | POA: Diagnosis not present

## 2024-04-30 DIAGNOSIS — K449 Diaphragmatic hernia without obstruction or gangrene: Secondary | ICD-10-CM | POA: Diagnosis not present

## 2024-04-30 DIAGNOSIS — Z8673 Personal history of transient ischemic attack (TIA), and cerebral infarction without residual deficits: Secondary | ICD-10-CM | POA: Diagnosis not present

## 2024-04-30 DIAGNOSIS — M47819 Spondylosis without myelopathy or radiculopathy, site unspecified: Secondary | ICD-10-CM | POA: Diagnosis not present

## 2024-04-30 DIAGNOSIS — I700 Atherosclerosis of aorta: Secondary | ICD-10-CM | POA: Diagnosis not present

## 2024-04-30 DIAGNOSIS — M8008XD Age-related osteoporosis with current pathological fracture, vertebra(e), subsequent encounter for fracture with routine healing: Secondary | ICD-10-CM | POA: Diagnosis not present

## 2024-04-30 DIAGNOSIS — F32A Depression, unspecified: Secondary | ICD-10-CM | POA: Diagnosis not present

## 2024-04-30 DIAGNOSIS — I4891 Unspecified atrial fibrillation: Secondary | ICD-10-CM | POA: Diagnosis not present

## 2024-04-30 DIAGNOSIS — E785 Hyperlipidemia, unspecified: Secondary | ICD-10-CM | POA: Diagnosis not present

## 2024-04-30 DIAGNOSIS — K5730 Diverticulosis of large intestine without perforation or abscess without bleeding: Secondary | ICD-10-CM | POA: Diagnosis not present

## 2024-04-30 DIAGNOSIS — Z87891 Personal history of nicotine dependence: Secondary | ICD-10-CM | POA: Diagnosis not present

## 2024-05-03 DIAGNOSIS — I480 Paroxysmal atrial fibrillation: Secondary | ICD-10-CM | POA: Diagnosis not present

## 2024-05-03 DIAGNOSIS — W19XXXA Unspecified fall, initial encounter: Secondary | ICD-10-CM | POA: Diagnosis not present

## 2024-05-03 DIAGNOSIS — U071 COVID-19: Secondary | ICD-10-CM | POA: Diagnosis not present

## 2024-05-03 DIAGNOSIS — I4891 Unspecified atrial fibrillation: Secondary | ICD-10-CM | POA: Diagnosis not present

## 2024-05-03 DIAGNOSIS — S32020A Wedge compression fracture of second lumbar vertebra, initial encounter for closed fracture: Secondary | ICD-10-CM | POA: Diagnosis not present

## 2024-05-03 DIAGNOSIS — J984 Other disorders of lung: Secondary | ICD-10-CM | POA: Diagnosis not present

## 2024-05-03 DIAGNOSIS — G9341 Metabolic encephalopathy: Secondary | ICD-10-CM | POA: Diagnosis not present

## 2024-05-03 DIAGNOSIS — I959 Hypotension, unspecified: Secondary | ICD-10-CM | POA: Diagnosis not present

## 2024-05-03 DIAGNOSIS — E876 Hypokalemia: Secondary | ICD-10-CM | POA: Diagnosis not present

## 2024-05-03 DIAGNOSIS — R531 Weakness: Secondary | ICD-10-CM | POA: Diagnosis not present

## 2024-05-03 DIAGNOSIS — R4182 Altered mental status, unspecified: Secondary | ICD-10-CM | POA: Diagnosis not present

## 2024-05-03 DIAGNOSIS — S32030A Wedge compression fracture of third lumbar vertebra, initial encounter for closed fracture: Secondary | ICD-10-CM | POA: Diagnosis not present

## 2024-05-03 DIAGNOSIS — J159 Unspecified bacterial pneumonia: Secondary | ICD-10-CM | POA: Diagnosis not present

## 2024-05-03 DIAGNOSIS — E871 Hypo-osmolality and hyponatremia: Secondary | ICD-10-CM | POA: Diagnosis not present

## 2024-05-03 DIAGNOSIS — F03C3 Unspecified dementia, severe, with mood disturbance: Secondary | ICD-10-CM | POA: Diagnosis not present

## 2024-05-03 DIAGNOSIS — R61 Generalized hyperhidrosis: Secondary | ICD-10-CM | POA: Diagnosis not present

## 2024-05-07 DIAGNOSIS — M8008XD Age-related osteoporosis with current pathological fracture, vertebra(e), subsequent encounter for fracture with routine healing: Secondary | ICD-10-CM | POA: Diagnosis not present

## 2024-05-07 DIAGNOSIS — K449 Diaphragmatic hernia without obstruction or gangrene: Secondary | ICD-10-CM | POA: Diagnosis not present

## 2024-05-07 DIAGNOSIS — Z87891 Personal history of nicotine dependence: Secondary | ICD-10-CM | POA: Diagnosis not present

## 2024-05-07 DIAGNOSIS — M47819 Spondylosis without myelopathy or radiculopathy, site unspecified: Secondary | ICD-10-CM | POA: Diagnosis not present

## 2024-05-07 DIAGNOSIS — K5730 Diverticulosis of large intestine without perforation or abscess without bleeding: Secondary | ICD-10-CM | POA: Diagnosis not present

## 2024-05-07 DIAGNOSIS — Z8673 Personal history of transient ischemic attack (TIA), and cerebral infarction without residual deficits: Secondary | ICD-10-CM | POA: Diagnosis not present

## 2024-05-07 DIAGNOSIS — I700 Atherosclerosis of aorta: Secondary | ICD-10-CM | POA: Diagnosis not present

## 2024-05-07 DIAGNOSIS — F32A Depression, unspecified: Secondary | ICD-10-CM | POA: Diagnosis not present

## 2024-05-07 DIAGNOSIS — E785 Hyperlipidemia, unspecified: Secondary | ICD-10-CM | POA: Diagnosis not present

## 2024-05-10 DIAGNOSIS — E785 Hyperlipidemia, unspecified: Secondary | ICD-10-CM | POA: Diagnosis not present

## 2024-05-10 DIAGNOSIS — M8008XD Age-related osteoporosis with current pathological fracture, vertebra(e), subsequent encounter for fracture with routine healing: Secondary | ICD-10-CM | POA: Diagnosis not present

## 2024-05-10 DIAGNOSIS — K5730 Diverticulosis of large intestine without perforation or abscess without bleeding: Secondary | ICD-10-CM | POA: Diagnosis not present

## 2024-05-10 DIAGNOSIS — F32A Depression, unspecified: Secondary | ICD-10-CM | POA: Diagnosis not present

## 2024-05-10 DIAGNOSIS — Z8673 Personal history of transient ischemic attack (TIA), and cerebral infarction without residual deficits: Secondary | ICD-10-CM | POA: Diagnosis not present

## 2024-05-10 DIAGNOSIS — M47819 Spondylosis without myelopathy or radiculopathy, site unspecified: Secondary | ICD-10-CM | POA: Diagnosis not present

## 2024-05-10 DIAGNOSIS — I700 Atherosclerosis of aorta: Secondary | ICD-10-CM | POA: Diagnosis not present

## 2024-05-10 DIAGNOSIS — Z87891 Personal history of nicotine dependence: Secondary | ICD-10-CM | POA: Diagnosis not present

## 2024-05-10 DIAGNOSIS — K449 Diaphragmatic hernia without obstruction or gangrene: Secondary | ICD-10-CM | POA: Diagnosis not present

## 2024-05-12 DIAGNOSIS — E785 Hyperlipidemia, unspecified: Secondary | ICD-10-CM | POA: Diagnosis not present

## 2024-05-12 DIAGNOSIS — Z87891 Personal history of nicotine dependence: Secondary | ICD-10-CM | POA: Diagnosis not present

## 2024-05-12 DIAGNOSIS — K5730 Diverticulosis of large intestine without perforation or abscess without bleeding: Secondary | ICD-10-CM | POA: Diagnosis not present

## 2024-05-12 DIAGNOSIS — Z09 Encounter for follow-up examination after completed treatment for conditions other than malignant neoplasm: Secondary | ICD-10-CM | POA: Diagnosis not present

## 2024-05-12 DIAGNOSIS — F32A Depression, unspecified: Secondary | ICD-10-CM | POA: Diagnosis not present

## 2024-05-12 DIAGNOSIS — E876 Hypokalemia: Secondary | ICD-10-CM | POA: Diagnosis not present

## 2024-05-12 DIAGNOSIS — Z8673 Personal history of transient ischemic attack (TIA), and cerebral infarction without residual deficits: Secondary | ICD-10-CM | POA: Diagnosis not present

## 2024-05-12 DIAGNOSIS — M8448XA Pathological fracture, other site, initial encounter for fracture: Secondary | ICD-10-CM | POA: Diagnosis not present

## 2024-05-12 DIAGNOSIS — M8008XD Age-related osteoporosis with current pathological fracture, vertebra(e), subsequent encounter for fracture with routine healing: Secondary | ICD-10-CM | POA: Diagnosis not present

## 2024-05-12 DIAGNOSIS — Z6825 Body mass index (BMI) 25.0-25.9, adult: Secondary | ICD-10-CM | POA: Diagnosis not present

## 2024-05-12 DIAGNOSIS — M47819 Spondylosis without myelopathy or radiculopathy, site unspecified: Secondary | ICD-10-CM | POA: Diagnosis not present

## 2024-05-12 DIAGNOSIS — I700 Atherosclerosis of aorta: Secondary | ICD-10-CM | POA: Diagnosis not present

## 2024-05-12 DIAGNOSIS — K449 Diaphragmatic hernia without obstruction or gangrene: Secondary | ICD-10-CM | POA: Diagnosis not present

## 2024-05-13 DIAGNOSIS — K5730 Diverticulosis of large intestine without perforation or abscess without bleeding: Secondary | ICD-10-CM | POA: Diagnosis not present

## 2024-05-13 DIAGNOSIS — M47819 Spondylosis without myelopathy or radiculopathy, site unspecified: Secondary | ICD-10-CM | POA: Diagnosis not present

## 2024-05-13 DIAGNOSIS — E785 Hyperlipidemia, unspecified: Secondary | ICD-10-CM | POA: Diagnosis not present

## 2024-05-13 DIAGNOSIS — M8008XD Age-related osteoporosis with current pathological fracture, vertebra(e), subsequent encounter for fracture with routine healing: Secondary | ICD-10-CM | POA: Diagnosis not present

## 2024-05-13 DIAGNOSIS — K449 Diaphragmatic hernia without obstruction or gangrene: Secondary | ICD-10-CM | POA: Diagnosis not present

## 2024-05-13 DIAGNOSIS — I700 Atherosclerosis of aorta: Secondary | ICD-10-CM | POA: Diagnosis not present

## 2024-05-13 DIAGNOSIS — Z87891 Personal history of nicotine dependence: Secondary | ICD-10-CM | POA: Diagnosis not present

## 2024-05-13 DIAGNOSIS — F32A Depression, unspecified: Secondary | ICD-10-CM | POA: Diagnosis not present

## 2024-05-13 DIAGNOSIS — Z8673 Personal history of transient ischemic attack (TIA), and cerebral infarction without residual deficits: Secondary | ICD-10-CM | POA: Diagnosis not present

## 2024-05-14 DIAGNOSIS — Z87891 Personal history of nicotine dependence: Secondary | ICD-10-CM | POA: Diagnosis not present

## 2024-05-14 DIAGNOSIS — M47819 Spondylosis without myelopathy or radiculopathy, site unspecified: Secondary | ICD-10-CM | POA: Diagnosis not present

## 2024-05-14 DIAGNOSIS — K449 Diaphragmatic hernia without obstruction or gangrene: Secondary | ICD-10-CM | POA: Diagnosis not present

## 2024-05-14 DIAGNOSIS — E785 Hyperlipidemia, unspecified: Secondary | ICD-10-CM | POA: Diagnosis not present

## 2024-05-14 DIAGNOSIS — F32A Depression, unspecified: Secondary | ICD-10-CM | POA: Diagnosis not present

## 2024-05-14 DIAGNOSIS — M8008XD Age-related osteoporosis with current pathological fracture, vertebra(e), subsequent encounter for fracture with routine healing: Secondary | ICD-10-CM | POA: Diagnosis not present

## 2024-05-14 DIAGNOSIS — I700 Atherosclerosis of aorta: Secondary | ICD-10-CM | POA: Diagnosis not present

## 2024-05-14 DIAGNOSIS — K5730 Diverticulosis of large intestine without perforation or abscess without bleeding: Secondary | ICD-10-CM | POA: Diagnosis not present

## 2024-05-14 DIAGNOSIS — Z8673 Personal history of transient ischemic attack (TIA), and cerebral infarction without residual deficits: Secondary | ICD-10-CM | POA: Diagnosis not present

## 2024-05-17 DIAGNOSIS — Z8673 Personal history of transient ischemic attack (TIA), and cerebral infarction without residual deficits: Secondary | ICD-10-CM | POA: Diagnosis not present

## 2024-05-17 DIAGNOSIS — I700 Atherosclerosis of aorta: Secondary | ICD-10-CM | POA: Diagnosis not present

## 2024-05-17 DIAGNOSIS — K449 Diaphragmatic hernia without obstruction or gangrene: Secondary | ICD-10-CM | POA: Diagnosis not present

## 2024-05-17 DIAGNOSIS — F32A Depression, unspecified: Secondary | ICD-10-CM | POA: Diagnosis not present

## 2024-05-17 DIAGNOSIS — M47819 Spondylosis without myelopathy or radiculopathy, site unspecified: Secondary | ICD-10-CM | POA: Diagnosis not present

## 2024-05-17 DIAGNOSIS — M8008XD Age-related osteoporosis with current pathological fracture, vertebra(e), subsequent encounter for fracture with routine healing: Secondary | ICD-10-CM | POA: Diagnosis not present

## 2024-05-17 DIAGNOSIS — K5730 Diverticulosis of large intestine without perforation or abscess without bleeding: Secondary | ICD-10-CM | POA: Diagnosis not present

## 2024-05-17 DIAGNOSIS — Z87891 Personal history of nicotine dependence: Secondary | ICD-10-CM | POA: Diagnosis not present

## 2024-05-17 DIAGNOSIS — E785 Hyperlipidemia, unspecified: Secondary | ICD-10-CM | POA: Diagnosis not present

## 2024-05-18 DIAGNOSIS — Z8673 Personal history of transient ischemic attack (TIA), and cerebral infarction without residual deficits: Secondary | ICD-10-CM | POA: Diagnosis not present

## 2024-05-18 DIAGNOSIS — Z87891 Personal history of nicotine dependence: Secondary | ICD-10-CM | POA: Diagnosis not present

## 2024-05-18 DIAGNOSIS — I482 Chronic atrial fibrillation, unspecified: Secondary | ICD-10-CM | POA: Diagnosis not present

## 2024-05-18 DIAGNOSIS — E785 Hyperlipidemia, unspecified: Secondary | ICD-10-CM | POA: Diagnosis not present

## 2024-05-18 DIAGNOSIS — K449 Diaphragmatic hernia without obstruction or gangrene: Secondary | ICD-10-CM | POA: Diagnosis not present

## 2024-05-18 DIAGNOSIS — F32A Depression, unspecified: Secondary | ICD-10-CM | POA: Diagnosis not present

## 2024-05-18 DIAGNOSIS — K5730 Diverticulosis of large intestine without perforation or abscess without bleeding: Secondary | ICD-10-CM | POA: Diagnosis not present

## 2024-05-18 DIAGNOSIS — M47819 Spondylosis without myelopathy or radiculopathy, site unspecified: Secondary | ICD-10-CM | POA: Diagnosis not present

## 2024-05-18 DIAGNOSIS — M8008XD Age-related osteoporosis with current pathological fracture, vertebra(e), subsequent encounter for fracture with routine healing: Secondary | ICD-10-CM | POA: Diagnosis not present

## 2024-05-18 DIAGNOSIS — I700 Atherosclerosis of aorta: Secondary | ICD-10-CM | POA: Diagnosis not present

## 2024-05-19 DIAGNOSIS — I700 Atherosclerosis of aorta: Secondary | ICD-10-CM | POA: Diagnosis not present

## 2024-05-19 DIAGNOSIS — K5730 Diverticulosis of large intestine without perforation or abscess without bleeding: Secondary | ICD-10-CM | POA: Diagnosis not present

## 2024-05-19 DIAGNOSIS — M47819 Spondylosis without myelopathy or radiculopathy, site unspecified: Secondary | ICD-10-CM | POA: Diagnosis not present

## 2024-05-19 DIAGNOSIS — Z87891 Personal history of nicotine dependence: Secondary | ICD-10-CM | POA: Diagnosis not present

## 2024-05-19 DIAGNOSIS — E785 Hyperlipidemia, unspecified: Secondary | ICD-10-CM | POA: Diagnosis not present

## 2024-05-19 DIAGNOSIS — Z8673 Personal history of transient ischemic attack (TIA), and cerebral infarction without residual deficits: Secondary | ICD-10-CM | POA: Diagnosis not present

## 2024-05-19 DIAGNOSIS — M8008XD Age-related osteoporosis with current pathological fracture, vertebra(e), subsequent encounter for fracture with routine healing: Secondary | ICD-10-CM | POA: Diagnosis not present

## 2024-05-19 DIAGNOSIS — F32A Depression, unspecified: Secondary | ICD-10-CM | POA: Diagnosis not present

## 2024-05-19 DIAGNOSIS — E876 Hypokalemia: Secondary | ICD-10-CM | POA: Diagnosis not present

## 2024-05-19 DIAGNOSIS — K449 Diaphragmatic hernia without obstruction or gangrene: Secondary | ICD-10-CM | POA: Diagnosis not present

## 2024-05-21 DIAGNOSIS — K449 Diaphragmatic hernia without obstruction or gangrene: Secondary | ICD-10-CM | POA: Diagnosis not present

## 2024-05-21 DIAGNOSIS — F32A Depression, unspecified: Secondary | ICD-10-CM | POA: Diagnosis not present

## 2024-05-21 DIAGNOSIS — M8008XD Age-related osteoporosis with current pathological fracture, vertebra(e), subsequent encounter for fracture with routine healing: Secondary | ICD-10-CM | POA: Diagnosis not present

## 2024-05-21 DIAGNOSIS — I700 Atherosclerosis of aorta: Secondary | ICD-10-CM | POA: Diagnosis not present

## 2024-05-21 DIAGNOSIS — Z87891 Personal history of nicotine dependence: Secondary | ICD-10-CM | POA: Diagnosis not present

## 2024-05-21 DIAGNOSIS — M47819 Spondylosis without myelopathy or radiculopathy, site unspecified: Secondary | ICD-10-CM | POA: Diagnosis not present

## 2024-05-21 DIAGNOSIS — E785 Hyperlipidemia, unspecified: Secondary | ICD-10-CM | POA: Diagnosis not present

## 2024-05-21 DIAGNOSIS — Z8673 Personal history of transient ischemic attack (TIA), and cerebral infarction without residual deficits: Secondary | ICD-10-CM | POA: Diagnosis not present

## 2024-05-21 DIAGNOSIS — K5730 Diverticulosis of large intestine without perforation or abscess without bleeding: Secondary | ICD-10-CM | POA: Diagnosis not present

## 2024-05-24 DIAGNOSIS — F32A Depression, unspecified: Secondary | ICD-10-CM | POA: Diagnosis not present

## 2024-05-24 DIAGNOSIS — M8008XD Age-related osteoporosis with current pathological fracture, vertebra(e), subsequent encounter for fracture with routine healing: Secondary | ICD-10-CM | POA: Diagnosis not present

## 2024-05-24 DIAGNOSIS — K449 Diaphragmatic hernia without obstruction or gangrene: Secondary | ICD-10-CM | POA: Diagnosis not present

## 2024-05-24 DIAGNOSIS — E785 Hyperlipidemia, unspecified: Secondary | ICD-10-CM | POA: Diagnosis not present

## 2024-05-24 DIAGNOSIS — M47819 Spondylosis without myelopathy or radiculopathy, site unspecified: Secondary | ICD-10-CM | POA: Diagnosis not present

## 2024-05-24 DIAGNOSIS — I700 Atherosclerosis of aorta: Secondary | ICD-10-CM | POA: Diagnosis not present

## 2024-05-24 DIAGNOSIS — Z8673 Personal history of transient ischemic attack (TIA), and cerebral infarction without residual deficits: Secondary | ICD-10-CM | POA: Diagnosis not present

## 2024-05-24 DIAGNOSIS — Z87891 Personal history of nicotine dependence: Secondary | ICD-10-CM | POA: Diagnosis not present

## 2024-05-24 DIAGNOSIS — K5730 Diverticulosis of large intestine without perforation or abscess without bleeding: Secondary | ICD-10-CM | POA: Diagnosis not present

## 2024-05-25 DIAGNOSIS — I700 Atherosclerosis of aorta: Secondary | ICD-10-CM | POA: Diagnosis not present

## 2024-05-25 DIAGNOSIS — Z8673 Personal history of transient ischemic attack (TIA), and cerebral infarction without residual deficits: Secondary | ICD-10-CM | POA: Diagnosis not present

## 2024-05-25 DIAGNOSIS — E785 Hyperlipidemia, unspecified: Secondary | ICD-10-CM | POA: Diagnosis not present

## 2024-05-25 DIAGNOSIS — M47819 Spondylosis without myelopathy or radiculopathy, site unspecified: Secondary | ICD-10-CM | POA: Diagnosis not present

## 2024-05-25 DIAGNOSIS — K5730 Diverticulosis of large intestine without perforation or abscess without bleeding: Secondary | ICD-10-CM | POA: Diagnosis not present

## 2024-05-25 DIAGNOSIS — F32A Depression, unspecified: Secondary | ICD-10-CM | POA: Diagnosis not present

## 2024-05-25 DIAGNOSIS — K449 Diaphragmatic hernia without obstruction or gangrene: Secondary | ICD-10-CM | POA: Diagnosis not present

## 2024-05-25 DIAGNOSIS — Z87891 Personal history of nicotine dependence: Secondary | ICD-10-CM | POA: Diagnosis not present

## 2024-05-25 DIAGNOSIS — M8008XD Age-related osteoporosis with current pathological fracture, vertebra(e), subsequent encounter for fracture with routine healing: Secondary | ICD-10-CM | POA: Diagnosis not present

## 2024-05-28 DIAGNOSIS — I700 Atherosclerosis of aorta: Secondary | ICD-10-CM | POA: Diagnosis not present

## 2024-05-28 DIAGNOSIS — M47819 Spondylosis without myelopathy or radiculopathy, site unspecified: Secondary | ICD-10-CM | POA: Diagnosis not present

## 2024-05-28 DIAGNOSIS — F32A Depression, unspecified: Secondary | ICD-10-CM | POA: Diagnosis not present

## 2024-05-28 DIAGNOSIS — E785 Hyperlipidemia, unspecified: Secondary | ICD-10-CM | POA: Diagnosis not present

## 2024-05-28 DIAGNOSIS — K449 Diaphragmatic hernia without obstruction or gangrene: Secondary | ICD-10-CM | POA: Diagnosis not present

## 2024-05-28 DIAGNOSIS — M8008XD Age-related osteoporosis with current pathological fracture, vertebra(e), subsequent encounter for fracture with routine healing: Secondary | ICD-10-CM | POA: Diagnosis not present

## 2024-05-28 DIAGNOSIS — K5730 Diverticulosis of large intestine without perforation or abscess without bleeding: Secondary | ICD-10-CM | POA: Diagnosis not present

## 2024-05-28 DIAGNOSIS — Z87891 Personal history of nicotine dependence: Secondary | ICD-10-CM | POA: Diagnosis not present

## 2024-05-28 DIAGNOSIS — Z8673 Personal history of transient ischemic attack (TIA), and cerebral infarction without residual deficits: Secondary | ICD-10-CM | POA: Diagnosis not present

## 2024-05-30 DIAGNOSIS — F03C11 Unspecified dementia, severe, with agitation: Secondary | ICD-10-CM | POA: Diagnosis not present

## 2024-05-30 DIAGNOSIS — F03C3 Unspecified dementia, severe, with mood disturbance: Secondary | ICD-10-CM | POA: Diagnosis not present

## 2024-05-30 DIAGNOSIS — J159 Unspecified bacterial pneumonia: Secondary | ICD-10-CM | POA: Diagnosis not present

## 2024-05-30 DIAGNOSIS — U071 COVID-19: Secondary | ICD-10-CM | POA: Diagnosis not present

## 2024-05-30 DIAGNOSIS — I700 Atherosclerosis of aorta: Secondary | ICD-10-CM | POA: Diagnosis not present

## 2024-05-30 DIAGNOSIS — M8008XD Age-related osteoporosis with current pathological fracture, vertebra(e), subsequent encounter for fracture with routine healing: Secondary | ICD-10-CM | POA: Diagnosis not present

## 2024-05-30 DIAGNOSIS — F32A Depression, unspecified: Secondary | ICD-10-CM | POA: Diagnosis not present

## 2024-05-30 DIAGNOSIS — F03C4 Unspecified dementia, severe, with anxiety: Secondary | ICD-10-CM | POA: Diagnosis not present

## 2024-05-30 DIAGNOSIS — I480 Paroxysmal atrial fibrillation: Secondary | ICD-10-CM | POA: Diagnosis not present

## 2024-06-01 DIAGNOSIS — F03C3 Unspecified dementia, severe, with mood disturbance: Secondary | ICD-10-CM | POA: Diagnosis not present

## 2024-06-01 DIAGNOSIS — U071 COVID-19: Secondary | ICD-10-CM | POA: Diagnosis not present

## 2024-06-01 DIAGNOSIS — I700 Atherosclerosis of aorta: Secondary | ICD-10-CM | POA: Diagnosis not present

## 2024-06-01 DIAGNOSIS — J159 Unspecified bacterial pneumonia: Secondary | ICD-10-CM | POA: Diagnosis not present

## 2024-06-01 DIAGNOSIS — I480 Paroxysmal atrial fibrillation: Secondary | ICD-10-CM | POA: Diagnosis not present

## 2024-06-01 DIAGNOSIS — M8008XD Age-related osteoporosis with current pathological fracture, vertebra(e), subsequent encounter for fracture with routine healing: Secondary | ICD-10-CM | POA: Diagnosis not present

## 2024-06-01 DIAGNOSIS — F03C11 Unspecified dementia, severe, with agitation: Secondary | ICD-10-CM | POA: Diagnosis not present

## 2024-06-01 DIAGNOSIS — F03C4 Unspecified dementia, severe, with anxiety: Secondary | ICD-10-CM | POA: Diagnosis not present

## 2024-06-01 DIAGNOSIS — F32A Depression, unspecified: Secondary | ICD-10-CM | POA: Diagnosis not present

## 2024-06-02 DIAGNOSIS — M4856XA Collapsed vertebra, not elsewhere classified, lumbar region, initial encounter for fracture: Secondary | ICD-10-CM | POA: Diagnosis not present

## 2024-06-02 DIAGNOSIS — M8448XA Pathological fracture, other site, initial encounter for fracture: Secondary | ICD-10-CM | POA: Diagnosis not present

## 2024-06-03 DIAGNOSIS — H26491 Other secondary cataract, right eye: Secondary | ICD-10-CM | POA: Diagnosis not present

## 2024-06-03 DIAGNOSIS — H524 Presbyopia: Secondary | ICD-10-CM | POA: Diagnosis not present

## 2024-06-03 DIAGNOSIS — Z961 Presence of intraocular lens: Secondary | ICD-10-CM | POA: Diagnosis not present

## 2024-06-03 DIAGNOSIS — H52223 Regular astigmatism, bilateral: Secondary | ICD-10-CM | POA: Diagnosis not present

## 2024-06-03 DIAGNOSIS — H353134 Nonexudative age-related macular degeneration, bilateral, advanced atrophic with subfoveal involvement: Secondary | ICD-10-CM | POA: Diagnosis not present

## 2024-06-03 DIAGNOSIS — H35343 Macular cyst, hole, or pseudohole, bilateral: Secondary | ICD-10-CM | POA: Diagnosis not present

## 2024-06-04 DIAGNOSIS — F32A Depression, unspecified: Secondary | ICD-10-CM | POA: Diagnosis not present

## 2024-06-04 DIAGNOSIS — F03C3 Unspecified dementia, severe, with mood disturbance: Secondary | ICD-10-CM | POA: Diagnosis not present

## 2024-06-04 DIAGNOSIS — M8008XD Age-related osteoporosis with current pathological fracture, vertebra(e), subsequent encounter for fracture with routine healing: Secondary | ICD-10-CM | POA: Diagnosis not present

## 2024-06-04 DIAGNOSIS — I480 Paroxysmal atrial fibrillation: Secondary | ICD-10-CM | POA: Diagnosis not present

## 2024-06-04 DIAGNOSIS — F03C11 Unspecified dementia, severe, with agitation: Secondary | ICD-10-CM | POA: Diagnosis not present

## 2024-06-04 DIAGNOSIS — F03C4 Unspecified dementia, severe, with anxiety: Secondary | ICD-10-CM | POA: Diagnosis not present

## 2024-06-04 DIAGNOSIS — U071 COVID-19: Secondary | ICD-10-CM | POA: Diagnosis not present

## 2024-06-04 DIAGNOSIS — I700 Atherosclerosis of aorta: Secondary | ICD-10-CM | POA: Diagnosis not present

## 2024-06-04 DIAGNOSIS — J159 Unspecified bacterial pneumonia: Secondary | ICD-10-CM | POA: Diagnosis not present

## 2024-06-07 DIAGNOSIS — F03C4 Unspecified dementia, severe, with anxiety: Secondary | ICD-10-CM | POA: Diagnosis not present

## 2024-06-07 DIAGNOSIS — F03C3 Unspecified dementia, severe, with mood disturbance: Secondary | ICD-10-CM | POA: Diagnosis not present

## 2024-06-07 DIAGNOSIS — J159 Unspecified bacterial pneumonia: Secondary | ICD-10-CM | POA: Diagnosis not present

## 2024-06-07 DIAGNOSIS — I480 Paroxysmal atrial fibrillation: Secondary | ICD-10-CM | POA: Diagnosis not present

## 2024-06-07 DIAGNOSIS — F32A Depression, unspecified: Secondary | ICD-10-CM | POA: Diagnosis not present

## 2024-06-07 DIAGNOSIS — U071 COVID-19: Secondary | ICD-10-CM | POA: Diagnosis not present

## 2024-06-07 DIAGNOSIS — I700 Atherosclerosis of aorta: Secondary | ICD-10-CM | POA: Diagnosis not present

## 2024-06-07 DIAGNOSIS — F03C11 Unspecified dementia, severe, with agitation: Secondary | ICD-10-CM | POA: Diagnosis not present

## 2024-06-07 DIAGNOSIS — M8008XD Age-related osteoporosis with current pathological fracture, vertebra(e), subsequent encounter for fracture with routine healing: Secondary | ICD-10-CM | POA: Diagnosis not present

## 2024-06-08 DIAGNOSIS — F03C3 Unspecified dementia, severe, with mood disturbance: Secondary | ICD-10-CM | POA: Diagnosis not present

## 2024-06-08 DIAGNOSIS — F32A Depression, unspecified: Secondary | ICD-10-CM | POA: Diagnosis not present

## 2024-06-08 DIAGNOSIS — U071 COVID-19: Secondary | ICD-10-CM | POA: Diagnosis not present

## 2024-06-08 DIAGNOSIS — I480 Paroxysmal atrial fibrillation: Secondary | ICD-10-CM | POA: Diagnosis not present

## 2024-06-08 DIAGNOSIS — I700 Atherosclerosis of aorta: Secondary | ICD-10-CM | POA: Diagnosis not present

## 2024-06-08 DIAGNOSIS — J159 Unspecified bacterial pneumonia: Secondary | ICD-10-CM | POA: Diagnosis not present

## 2024-06-08 DIAGNOSIS — F03C4 Unspecified dementia, severe, with anxiety: Secondary | ICD-10-CM | POA: Diagnosis not present

## 2024-06-08 DIAGNOSIS — M8008XD Age-related osteoporosis with current pathological fracture, vertebra(e), subsequent encounter for fracture with routine healing: Secondary | ICD-10-CM | POA: Diagnosis not present

## 2024-06-08 DIAGNOSIS — F03C11 Unspecified dementia, severe, with agitation: Secondary | ICD-10-CM | POA: Diagnosis not present

## 2024-06-11 DIAGNOSIS — F03C4 Unspecified dementia, severe, with anxiety: Secondary | ICD-10-CM | POA: Diagnosis not present

## 2024-06-11 DIAGNOSIS — F03C3 Unspecified dementia, severe, with mood disturbance: Secondary | ICD-10-CM | POA: Diagnosis not present

## 2024-06-11 DIAGNOSIS — M8008XD Age-related osteoporosis with current pathological fracture, vertebra(e), subsequent encounter for fracture with routine healing: Secondary | ICD-10-CM | POA: Diagnosis not present

## 2024-06-11 DIAGNOSIS — J159 Unspecified bacterial pneumonia: Secondary | ICD-10-CM | POA: Diagnosis not present

## 2024-06-11 DIAGNOSIS — U071 COVID-19: Secondary | ICD-10-CM | POA: Diagnosis not present

## 2024-06-11 DIAGNOSIS — I480 Paroxysmal atrial fibrillation: Secondary | ICD-10-CM | POA: Diagnosis not present

## 2024-06-11 DIAGNOSIS — I700 Atherosclerosis of aorta: Secondary | ICD-10-CM | POA: Diagnosis not present

## 2024-06-11 DIAGNOSIS — F03C11 Unspecified dementia, severe, with agitation: Secondary | ICD-10-CM | POA: Diagnosis not present

## 2024-06-11 DIAGNOSIS — F32A Depression, unspecified: Secondary | ICD-10-CM | POA: Diagnosis not present

## 2024-06-14 DIAGNOSIS — F32A Depression, unspecified: Secondary | ICD-10-CM | POA: Diagnosis not present

## 2024-06-14 DIAGNOSIS — J159 Unspecified bacterial pneumonia: Secondary | ICD-10-CM | POA: Diagnosis not present

## 2024-06-14 DIAGNOSIS — F03C11 Unspecified dementia, severe, with agitation: Secondary | ICD-10-CM | POA: Diagnosis not present

## 2024-06-14 DIAGNOSIS — F03C3 Unspecified dementia, severe, with mood disturbance: Secondary | ICD-10-CM | POA: Diagnosis not present

## 2024-06-14 DIAGNOSIS — U071 COVID-19: Secondary | ICD-10-CM | POA: Diagnosis not present

## 2024-06-14 DIAGNOSIS — I480 Paroxysmal atrial fibrillation: Secondary | ICD-10-CM | POA: Diagnosis not present

## 2024-06-14 DIAGNOSIS — F03C4 Unspecified dementia, severe, with anxiety: Secondary | ICD-10-CM | POA: Diagnosis not present

## 2024-06-14 DIAGNOSIS — I700 Atherosclerosis of aorta: Secondary | ICD-10-CM | POA: Diagnosis not present

## 2024-06-14 DIAGNOSIS — M8008XD Age-related osteoporosis with current pathological fracture, vertebra(e), subsequent encounter for fracture with routine healing: Secondary | ICD-10-CM | POA: Diagnosis not present

## 2024-06-28 DIAGNOSIS — S32030A Wedge compression fracture of third lumbar vertebra, initial encounter for closed fracture: Secondary | ICD-10-CM | POA: Diagnosis not present

## 2024-07-19 DIAGNOSIS — H6122 Impacted cerumen, left ear: Secondary | ICD-10-CM | POA: Diagnosis not present

## 2024-07-19 DIAGNOSIS — H6121 Impacted cerumen, right ear: Secondary | ICD-10-CM | POA: Diagnosis not present

## 2024-08-02 DIAGNOSIS — S32030A Wedge compression fracture of third lumbar vertebra, initial encounter for closed fracture: Secondary | ICD-10-CM | POA: Diagnosis not present

## 2024-10-07 DIAGNOSIS — Z111 Encounter for screening for respiratory tuberculosis: Secondary | ICD-10-CM | POA: Diagnosis not present

## 2024-10-07 DIAGNOSIS — M7989 Other specified soft tissue disorders: Secondary | ICD-10-CM | POA: Diagnosis not present

## 2024-10-07 DIAGNOSIS — Z6824 Body mass index (BMI) 24.0-24.9, adult: Secondary | ICD-10-CM | POA: Diagnosis not present
